# Patient Record
Sex: Female | Born: 1940 | Race: White | Hispanic: No | Marital: Single | Smoking: Never smoker
Health system: Southern US, Community
[De-identification: ages and names within clinical notes are randomized; demographics above are authoritative.]

## PROBLEM LIST (undated history)

## (undated) DIAGNOSIS — M1990 Unspecified osteoarthritis, unspecified site: Secondary | ICD-10-CM

## (undated) DIAGNOSIS — E7800 Pure hypercholesterolemia, unspecified: Secondary | ICD-10-CM

## (undated) DIAGNOSIS — I519 Heart disease, unspecified: Secondary | ICD-10-CM

## (undated) DIAGNOSIS — I509 Heart failure, unspecified: Secondary | ICD-10-CM

## (undated) DIAGNOSIS — D649 Anemia, unspecified: Secondary | ICD-10-CM

## (undated) DIAGNOSIS — N183 Chronic kidney disease, stage 3 (moderate): Secondary | ICD-10-CM

## (undated) DIAGNOSIS — R0902 Hypoxemia: Secondary | ICD-10-CM

## (undated) DIAGNOSIS — J45909 Unspecified asthma, uncomplicated: Secondary | ICD-10-CM

## (undated) DIAGNOSIS — K219 Gastro-esophageal reflux disease without esophagitis: Secondary | ICD-10-CM

## (undated) DIAGNOSIS — I10 Essential (primary) hypertension: Secondary | ICD-10-CM

## (undated) DIAGNOSIS — J439 Emphysema, unspecified: Secondary | ICD-10-CM

## (undated) DIAGNOSIS — T7840XA Allergy, unspecified, initial encounter: Secondary | ICD-10-CM

## (undated) DIAGNOSIS — J449 Chronic obstructive pulmonary disease, unspecified: Secondary | ICD-10-CM

## (undated) DIAGNOSIS — H269 Unspecified cataract: Secondary | ICD-10-CM

## (undated) DIAGNOSIS — E119 Type 2 diabetes mellitus without complications: Secondary | ICD-10-CM

## (undated) HISTORY — DX: Hypoxemia: R09.02

## (undated) HISTORY — DX: Pure hypercholesterolemia, unspecified: E78.00

## (undated) HISTORY — DX: Anemia, unspecified: D64.9

## (undated) HISTORY — DX: Type 2 diabetes mellitus without complications: E11.9

## (undated) HISTORY — PX: AORTIC VALVE REPLACEMENT: SHX41

## (undated) HISTORY — PX: CHOLECYSTECTOMY: SHX55

## (undated) HISTORY — DX: Chronic obstructive pulmonary disease, unspecified: J44.9

## (undated) HISTORY — DX: Allergy, unspecified, initial encounter: T78.40XA

## (undated) HISTORY — DX: Chronic kidney disease, stage 3 (moderate): N18.3

## (undated) HISTORY — DX: Heart failure, unspecified: I50.9

## (undated) HISTORY — DX: Heart disease, unspecified: I51.9

## (undated) HISTORY — DX: Unspecified cataract: H26.9

## (undated) HISTORY — DX: Emphysema, unspecified: J43.9

## (undated) HISTORY — DX: Gastro-esophageal reflux disease without esophagitis: K21.9

## (undated) HISTORY — PX: APPENDECTOMY: SHX54

## (undated) HISTORY — DX: Unspecified osteoarthritis, unspecified site: M19.90

## (undated) HISTORY — DX: Essential (primary) hypertension: I10

## (undated) HISTORY — DX: Unspecified asthma, uncomplicated: J45.909

---

## 1991-09-08 HISTORY — PX: CORONARY ARTERY BYPASS GRAFT: SHX141

## 2004-10-07 ENCOUNTER — Ambulatory Visit: Payer: Self-pay | Admitting: Cardiology

## 2006-07-17 ENCOUNTER — Ambulatory Visit: Payer: Self-pay | Admitting: Cardiology

## 2007-05-28 ENCOUNTER — Ambulatory Visit: Payer: Self-pay | Admitting: Cardiology

## 2008-02-07 ENCOUNTER — Ambulatory Visit: Payer: Self-pay | Admitting: Cardiology

## 2008-02-09 ENCOUNTER — Ambulatory Visit: Payer: Self-pay | Admitting: Cardiology

## 2008-02-13 ENCOUNTER — Ambulatory Visit: Payer: Self-pay | Admitting: Cardiology

## 2008-02-13 ENCOUNTER — Inpatient Hospital Stay (HOSPITAL_COMMUNITY): Admission: EM | Admit: 2008-02-13 | Discharge: 2008-02-15 | Payer: Self-pay | Admitting: Internal Medicine

## 2008-02-14 ENCOUNTER — Encounter: Payer: Self-pay | Admitting: Internal Medicine

## 2008-02-16 ENCOUNTER — Ambulatory Visit: Payer: Self-pay | Admitting: Cardiology

## 2011-01-20 NOTE — Discharge Summary (Signed)
Rachel Suarez, Rachel Suarez                 ACCOUNT NO.:  [id number]   MEDICAL RECORD NO.:  [id number]          PATIENT TYPE:  INP   LOCATION:  4705                         FACILITY:  [facility]   PHYSICIAN:  Shaune Pascal. Bensimhon, MDDATE OF BIRTH:  1940-10-05   DATE OF ADMISSION:  02/13/2008  DATE OF DISCHARGE:  02/15/2008                               DISCHARGE SUMMARY   PRIMARY CARDIOLOGIST:  Ernestine Mcmurray, MD,FACC in [place].   PRIMARY CARE PHYSICIAN:  Wiliam Ke.   PROCEDURES PERFORMED DURING HOSPITALIZATION:  1. Cardiac catheterization completed by Dr. Eustace Quail which      revealed patent LIMA to LAD.  There was no right heart cath done      secondary to access difficulty.  The patient had diastolic      dysfunction.  Please see Dr. Nichola Sizer note for more details.  2. Echocardiogram completed revealing left ventricular ejection      fraction of 55% with mild aortic valve stenosis with a mean      gradient of 12 mmHg.   FINAL DISCHARGE DIAGNOSES:  1. Dyspnea on exertion.      a.     Normal ejection fraction with mild aortic valve stenosis.      b.     Ambulating O2 sats, decreased to 88%.  2. Coronary artery disease status post coronary artery bypass grafting      in 1993 at [place].      a.     Status post cardiac catheterization with patent grafts.  3. Chronic obstructive pulmonary disease  4. Obstructive sleep apnea.  5. Morbid obesity.  6. Pulmonary hypertension (moderate).  7. Diastolic heart failure, acute on chronic.   HOSPITAL COURSE:  This is a 70 year old morbidly obese Caucasian female  with history of coronary artery disease status post three-vessel  coronary artery bypass grafting in 1993 at [facility] with  shortness of breath and CHF.  The patient was seen by Dr. Seward Speck and  cardiac consultation was completed.  The patient had normal coronary  cardiac enzymes.  The patient is a New York Heart Association class III  heart failure patient.  She is not ambulatory  and becomes easily  dyspneic with minimal exertion.   Secondary to complaints of recurrent shortness of breath and heart  failure with known history of coronary artery disease, the patient was  transferred to [facility] for cardiac catheterization.  The  patient was seen and examined by Dr. Johnny Bridge on admission and found  to be stable.  The patient's blood pressure was normal.  She weighed 136  kg.  The patient was scheduled for cardiac catheterization through  brachial access on February 13, 2008.  The patient's right coronary artery  was okay.  Circumflex was okay.  LAD had 70% proximal with competing  flow and LIMA to LAD was okay.  The patient was scheduled to have a  right heart cath; however, they were unable to access patient for that.  The patient was seen the day following by Dr. Domenic Polite who suggested the  patient have a walking O2  saturation and an echocardiogram completed.  Echocardiogram was completed on February 14, 2008, revealing LVEF of 55% with  mild aortic valve stenosis.  They were unable to see right ventricular  pressures or valves secondary to poor in quality and the patient's body  habitus.  The patient did have ambulatory O2 which showed 88%.  The  patient did qualify for home O2 with discussion about this with the  patient, she refused that.   On day of discharge, the patient was stable.  Blood pressure was  normalized.  She was instituted on Imdur 30 mg daily and Vasotec 5 mg  daily during the hospitalization and has tolerated that.  She was seen  by Dr. Haroldine Laws who suggested an outpatient right heart cath to the [facility]  lab and Dr. Arlina Robes discretion should he request further evaluation for  pulmonary hypertension at a followup appointment.  The patient was  anxious to return home and medications were prescribed.  The patient's  digoxin was discontinued.   DISCHARGE VITAL SIGNS:  Blood pressure 114/61, heart rate 64,  respirations 20, temperature 97.4,  O2 sat 97%.   Sodium 140, potassium 4.4, chloride 102, CO2 31, BUN 17, creatinine 1.0,  glucose 180, hemoglobin 10, hematocrit 30.2, white blood cells 6.4,  platelets 194, total cholesterol 149, triglycerides 202, HDL 44, and LDL  65.   DISCHARGE MEDICATIONS:  1. Metformin 1000 mg in the morning and 500 mg in the p.m.  2. Neurontin 300 mg t.i.d.  3. Allopurinol 300 mg daily.  4. Aspirin 325 daily.  5. Zebeta 2.5 mg daily.  6. Protonix 40 mg daily.  7. Reglan 10 mg three times a day.  8. Imdur 30 mg daily (new prescription provided).  9. Lasix 40 mg daily in the a.m., 20 mg in the p.m.  10.Potassium 20 mEq twice a day.  11.Flexeril 10 mg twice a day.  12.Crestor 20 mg at bedtime.  13.Vasotec 5 mg daily (new prescription provided).  14.Home O2 has been suggested at 2 L during the day (the patient has      refused at this time).   ALLERGIES:  The patient had allergies to DARVON, CODEINE, and CORTISONE.   FOLLOW-UP PLANS AND APPOINTMENT:  1. The patient is to follow up with Dr. Dannielle Burn on April 09, 2008, at      10:30 a.m.  2. Follow-up right heart catheterization can be completed as an      outpatient in the [facility] lab to [facility] at Dr. Arlina Robes      discretion.  3. It has been advised the patient have home O2 secondary to      ambulating desaturations.  The patient has refused.  4. Digoxin has been discontinued.  5. The patient has been advised on new medications that have been      prescribed on discharge to include Imdur and Vasotec.  6. The patient has been advised on post cardiac catheterization      instructions with particular emphasis on the right      arm brachial site for evidence of bleeding, hematoma, or signs of      infection.  7. The patient has been advised to bring all medications to follow up      appointments.   Time spent with the patient to include physician time 40 minutes.      Phill Myron. Purcell Nails, NP      [place] Bensimhon, MD   Electronically Signed    KML/MEDQ  D:  02/15/2008  T:  02/16/2008  Job:  [id number]   cc:   Wiliam Ke

## 2011-01-20 NOTE — Cardiovascular Report (Signed)
Rachel Suarez, Rachel Suarez                 ACCOUNT NO.:  [id number]   MEDICAL RECORD NO.:  [id number]          PATIENT TYPE:  INP   LOCATION:  4705                         FACILITY:  [facility]   PHYSICIAN:  Vanna Scotland. Olevia Perches, MD, FACCDATE OF BIRTH:  1940-10-17   DATE OF PROCEDURE:  02/13/2008  DATE OF DISCHARGE:  02/15/2008                            CARDIAC CATHETERIZATION   CLINICAL HISTORY:  Rachel Suarez is a 70 year old and had a history of  previous coronary artery bypass surgery.  She recently developed  increased symptoms of dyspnea and was seen by Dr. Dannielle Burn in [place].  It  was not clear whether dyspnea is related to ischemia or heart failure  and she was referred to [facility] for evaluation with  catheterization.   PROCEDURE:  Because of the patient's severe obesity, the procedure was  performed via the left brachial artery.  This was performed with 5-  [place] preformed coronary catheters.  A 5-French AL-1 catheter was used  for injection of the left coronary artery and FL 3.5 was used for  injection of the right coronary artery and mammary.  We attempted access  via the left subclavian vein, but were unable to obtain an access due to  occlusion of the subclavian vein, which was subsequently documented by  venogram.  Due to the patient's discomfort., we elected not to proceed  with that.   RESULTS:  The right coronary artery:  The right coronary artery was free  of significant disease and had only irregularities.   The left main coronary artery:  The left main coronary artery was free  of significant disease.   The left anterior descending artery:  The left anterior descending  artery had a 70% proximal stenosis with competing flow distally.   The circumflex artery.  The circumflex artery was free of significant  disease.   The LIMA graft to the LAD was patent and functioned normally.   CONCLUSION:  1. Coronary artery disease status post prior coronary artery bypass      graft  surgery.  2. Native vessel disease with 70% narrowing in the proximal LAD with      competing flow distally and no significant obstruction of the right      coronary artery or circumflex artery.  3. Patent LIMA graft to the LAD.   We did not do a left ventriculogram and did not do a right heart  catheterization due to very difficult procedure and the patient's  discomfort.  I discussed with Dr. Dannielle Burn regarding whether we should try  further attempts to do right heart catheterization at a later time.  We  will get a 2-D echo to evaluate the patient's LV function.      Bruce Alfonso Patten Olevia Perches, MD, [facility]  Electronically Signed     BRB/MEDQ  D:  06/14/2008  T:  06/15/2008  Job:  [id number]   cc:   Ernestine Mcmurray, MD,FACC  Wiliam Ke

## 2011-06-04 LAB — IRON AND TIBC
Iron: 74
Saturation Ratios: 21
TIBC: 349

## 2011-06-04 LAB — BASIC METABOLIC PANEL
BUN: 17
BUN: 22
Calcium: 8.4
Chloride: 101
Chloride: 104
Creatinine, Ser: 1.09
GFR calc Af Amer: >60
GFR calc Af Amer: >60
GFR calc non Af Amer: 53 — ABNORMAL LOW
Glucose, Bld: 127 — ABNORMAL HIGH
Potassium: 4.6
Potassium: 4.7
Sodium: 140
Sodium: 140

## 2011-06-04 LAB — FERRITIN: Ferritin: 35 (ref 10–291)

## 2011-06-04 LAB — CBC
HCT: 32 — ABNORMAL LOW
HCT: 33.6 — ABNORMAL LOW
Hemoglobin: 10 — ABNORMAL LOW
Hemoglobin: 10.6 — ABNORMAL LOW
MCV: 95
MCV: 95.2
Platelets: 262
RBC: 3.13 — ABNORMAL LOW
RBC: 3.37 — ABNORMAL LOW
RDW: 15.6 — ABNORMAL HIGH
RDW: 15.6 — ABNORMAL HIGH
WBC: 7.5

## 2011-06-04 LAB — LIPID PANEL
HDL: 44
Total CHOL/HDL Ratio: 3.4

## 2011-06-04 LAB — TRANSFERRIN: Transferrin: 307

## 2011-09-21 DIAGNOSIS — I10 Essential (primary) hypertension: Secondary | ICD-10-CM

## 2011-09-21 DIAGNOSIS — I509 Heart failure, unspecified: Secondary | ICD-10-CM | POA: Insufficient documentation

## 2011-09-21 HISTORY — DX: Essential (primary) hypertension: I10

## 2015-02-07 ENCOUNTER — Encounter (INDEPENDENT_AMBULATORY_CARE_PROVIDER_SITE_OTHER): Payer: Self-pay | Admitting: *Deleted

## 2015-02-07 NOTE — Telephone Encounter (Signed)
This encounter was created in error - please disregard.

## 2015-03-06 ENCOUNTER — Ambulatory Visit (INDEPENDENT_AMBULATORY_CARE_PROVIDER_SITE_OTHER): Payer: Medicare Other | Admitting: Internal Medicine

## 2015-03-06 ENCOUNTER — Encounter (INDEPENDENT_AMBULATORY_CARE_PROVIDER_SITE_OTHER): Payer: Self-pay | Admitting: Internal Medicine

## 2015-03-06 ENCOUNTER — Telehealth (INDEPENDENT_AMBULATORY_CARE_PROVIDER_SITE_OTHER): Payer: Self-pay | Admitting: *Deleted

## 2015-03-06 ENCOUNTER — Other Ambulatory Visit (INDEPENDENT_AMBULATORY_CARE_PROVIDER_SITE_OTHER): Payer: Self-pay | Admitting: *Deleted

## 2015-03-06 VITALS — BP 132/62 | HR 76 | Temp 98.6°F | Ht 62.0 in | Wt 242.3 lb

## 2015-03-06 DIAGNOSIS — R195 Other fecal abnormalities: Secondary | ICD-10-CM

## 2015-03-06 DIAGNOSIS — I519 Heart disease, unspecified: Secondary | ICD-10-CM | POA: Diagnosis not present

## 2015-03-06 DIAGNOSIS — R1310 Dysphagia, unspecified: Secondary | ICD-10-CM

## 2015-03-06 DIAGNOSIS — Z800 Family history of malignant neoplasm of digestive organs: Secondary | ICD-10-CM

## 2015-03-06 DIAGNOSIS — E119 Type 2 diabetes mellitus without complications: Secondary | ICD-10-CM | POA: Insufficient documentation

## 2015-03-06 DIAGNOSIS — K219 Gastro-esophageal reflux disease without esophagitis: Secondary | ICD-10-CM | POA: Diagnosis not present

## 2015-03-06 DIAGNOSIS — R1314 Dysphagia, pharyngoesophageal phase: Secondary | ICD-10-CM | POA: Diagnosis not present

## 2015-03-06 DIAGNOSIS — E7800 Pure hypercholesterolemia, unspecified: Secondary | ICD-10-CM | POA: Insufficient documentation

## 2015-03-06 DIAGNOSIS — R634 Abnormal weight loss: Secondary | ICD-10-CM

## 2015-03-06 MED ORDER — PEG 3350-KCL-NA BICARB-NACL 420 G PO SOLR: 4000.0000 mL | Freq: Once | ORAL | Status: DC

## 2015-03-06 MED ORDER — OMEPRAZOLE 40 MG PO CPDR: 40.0000 mg | DELAYED_RELEASE_CAPSULE | Freq: Every day | ORAL | Status: DC

## 2015-03-06 NOTE — Progress Notes (Signed)
Subjective:    Patient ID: Rachel Suarez, female    DOB: [birth year], 74 y.o.   MRN: [id number]  HPI Referred to our office by Dr. Eula Fried for dysphagia, GERD. Hamburgers and bread in particular will lodge. She says foods are lodging in her esophagus. Symptoms x 6 months. She occasionally has acid reflux.  She was taking ? Pill but did not help. She belches and has hiccups frequently. She occasionally has nausea. She does not have any abdominal pain.  Her appetite is not good. She has lost 40 pounds over the past 2 months.  Her normal weight is 263. She tells me she also has had diarrhea for two months. She is having 10-12 stools some days and some days she will have 2-3 stools. She has weakness.  She has not seen any blood in her stool except when she is constipated. She has constipation maybe 2-3 times a week.  Family hx of colon cancer in mother in her 48s. Her last colonoscopy years ago (greater than 10)  Patient is on oxygen Hx of ERCP with biliary sphincterotomy and placement of pancreatic stent felt to have biliary colic, felt either spincter of Oddie dysfunction or small stones. In 06/22/2008  Review of Systems Past Medical History  Diagnosis Date  . Diabetes     x 30 yrs  . Heart disease   . High cholesterol     Past Surgical History  Procedure Laterality Date  . Cholecystectomy    . Coronary artery bypass graft      Allergies  Allergen Reactions  . Cortisone     Rash, itch, burn    No current outpatient prescriptions on file prior to visit.   No current facility-administered medications on file prior to visit.   No current outpatient prescriptions on file prior to visit.   No current facility-administered medications on file prior to visit.   Current Outpatient Prescriptions  Medication Sig Dispense Refill  . allopurinol (ZYLOPRIM) 300 MG tablet Take 300 mg by mouth daily.    Marland Kitchen aspirin 81 MG tablet Take 81 mg by mouth daily.    . bisoprolol (ZEBETA) 5 MG  tablet Take 5 mg by mouth daily.    . cholecalciferol (VITAMIN D) 400 UNITS TABS tablet Take 5,000 Units by mouth.    . digoxin (LANOXIN) 0.125 MG tablet Take by mouth daily.    Marland Kitchen exenatide (BYETTA) 10 MCG/0.04ML SOPN injection Inject 10 mcg into the skin 2 (two) times daily with a meal.    . furosemide (LASIX) 40 MG tablet Take 40 mg by mouth 2 (two) times daily.    Marland Kitchen gabapentin (NEURONTIN) 300 MG capsule Take 300 mg by mouth 3 (three) times daily.    . insulin glargine (LANTUS) 100 UNIT/ML injection Inject into the skin 2 (two) times daily. 120 am    . isosorbide mononitrate (IMDUR) 60 MG 24 hr tablet Take 60 mg by mouth daily.    . ondansetron (ZOFRAN) 4 MG tablet Take 4 mg by mouth every 8 (eight) hours as needed for nausea or vomiting.    . rosuvastatin (CRESTOR) 20 MG tablet Take 20 mg by mouth daily.    . sitaGLIPtin (JANUVIA) 100 MG tablet Take 100 mg by mouth daily.     No current facility-administered medications for this visit.         Objective:   Physical Exam Patient is wheelchair bound Blood pressure 132/62, pulse 76, temperature 98.6 F (37 C), height 5\' 2"  (1.575  m), weight 242 lb 4.8 oz (109.907 kg). Alert and oriented. Skin warm and dry. Oral mucosa is moist.   . Sclera anicteric, conjunctivae is pink. Thyroid not enlarged. No cervical lymphadenopathy. Lungs clear. Heart regular rate and rhythm.Loud murmur heard  Abdomen is soft. Bowel sounds are positive. No hepatomegaly. No abdominal masses felt. No tenderness.  No edema to lower extremities.        Assessment & Plan:  Dysphagia. Stricture needs to be ruled out.  Diarrhea/alternationg with constipation. Family hx of colon cancer. Weight loss. Colonic neoplasm needs to be ruled out.  EGD/ED,Colonoscopy.The risks and benefits such as perforation, bleeding, and infection were reviewed with the patient and is agreeable. Rx for Omeprazole 40mg  sent to her pharmacy

## 2015-03-06 NOTE — Telephone Encounter (Signed)
Patient needs trilyte 

## 2015-03-06 NOTE — Patient Instructions (Signed)
EGD/ED, colonoscopy. The risks and benefits such as perforation, bleeding, and infection were reviewed with the patient and is agreeable.

## 2015-03-21 ENCOUNTER — Encounter (INDEPENDENT_AMBULATORY_CARE_PROVIDER_SITE_OTHER): Payer: Self-pay

## 2015-03-29 ENCOUNTER — Encounter (HOSPITAL_COMMUNITY): Payer: Self-pay

## 2015-03-29 ENCOUNTER — Ambulatory Visit (HOSPITAL_COMMUNITY)
Admission: RE | Admit: 2015-03-29 | Discharge: 2015-03-29 | Disposition: A | Discharge status: Home / Self Care | Payer: Medicare Other | Source: Ambulatory Visit | Attending: Internal Medicine | Admitting: Internal Medicine

## 2015-03-29 ENCOUNTER — Encounter (HOSPITAL_COMMUNITY)
Admission: RE | Disposition: A | Discharge status: Home / Self Care | Payer: Self-pay | Source: Ambulatory Visit | Attending: Internal Medicine

## 2015-03-29 DIAGNOSIS — D175 Benign lipomatous neoplasm of intra-abdominal organs: Secondary | ICD-10-CM | POA: Insufficient documentation

## 2015-03-29 DIAGNOSIS — E780 Pure hypercholesterolemia: Secondary | ICD-10-CM | POA: Insufficient documentation

## 2015-03-29 DIAGNOSIS — Z7982 Long term (current) use of aspirin: Secondary | ICD-10-CM | POA: Insufficient documentation

## 2015-03-29 DIAGNOSIS — R197 Diarrhea, unspecified: Secondary | ICD-10-CM | POA: Insufficient documentation

## 2015-03-29 DIAGNOSIS — R1310 Dysphagia, unspecified: Secondary | ICD-10-CM | POA: Diagnosis not present

## 2015-03-29 DIAGNOSIS — E119 Type 2 diabetes mellitus without complications: Secondary | ICD-10-CM | POA: Insufficient documentation

## 2015-03-29 DIAGNOSIS — D123 Benign neoplasm of transverse colon: Secondary | ICD-10-CM | POA: Insufficient documentation

## 2015-03-29 DIAGNOSIS — D124 Benign neoplasm of descending colon: Secondary | ICD-10-CM

## 2015-03-29 DIAGNOSIS — Z794 Long term (current) use of insulin: Secondary | ICD-10-CM | POA: Diagnosis not present

## 2015-03-29 DIAGNOSIS — Z1211 Encounter for screening for malignant neoplasm of colon: Secondary | ICD-10-CM | POA: Insufficient documentation

## 2015-03-29 DIAGNOSIS — Z79899 Other long term (current) drug therapy: Secondary | ICD-10-CM | POA: Insufficient documentation

## 2015-03-29 DIAGNOSIS — Z800 Family history of malignant neoplasm of digestive organs: Secondary | ICD-10-CM | POA: Diagnosis not present

## 2015-03-29 DIAGNOSIS — R195 Other fecal abnormalities: Secondary | ICD-10-CM

## 2015-03-29 DIAGNOSIS — D125 Benign neoplasm of sigmoid colon: Secondary | ICD-10-CM | POA: Insufficient documentation

## 2015-03-29 DIAGNOSIS — R1314 Dysphagia, pharyngoesophageal phase: Secondary | ICD-10-CM | POA: Diagnosis not present

## 2015-03-29 DIAGNOSIS — Z951 Presence of aortocoronary bypass graft: Secondary | ICD-10-CM | POA: Insufficient documentation

## 2015-03-29 DIAGNOSIS — K5730 Diverticulosis of large intestine without perforation or abscess without bleeding: Secondary | ICD-10-CM

## 2015-03-29 DIAGNOSIS — K644 Residual hemorrhoidal skin tags: Secondary | ICD-10-CM | POA: Diagnosis not present

## 2015-03-29 HISTORY — PX: ESOPHAGOGASTRODUODENOSCOPY: SHX5428

## 2015-03-29 HISTORY — PX: ESOPHAGEAL DILATION: SHX303

## 2015-03-29 HISTORY — PX: COLONOSCOPY: SHX5424

## 2015-03-29 LAB — GLUCOSE, CAPILLARY: Glucose-Capillary: 114 mg/dL — ABNORMAL HIGH (ref 65–99)

## 2015-03-29 SURGERY — COLONOSCOPY
Anesthesia: Moderate Sedation

## 2015-03-29 MED ORDER — BENEFIBER DRINK MIX PO PACK: 4.0000 g | PACK | Freq: Every day | ORAL | Status: DC

## 2015-03-29 MED ORDER — MIDAZOLAM HCL 5 MG/5ML IJ SOLN
INTRAMUSCULAR | Status: DC | PRN
  Administered 2015-03-29: 1 mg via INTRAVENOUS
  Administered 2015-03-29 (×2): 2 mg via INTRAVENOUS
  Administered 2015-03-29: 1 mg via INTRAVENOUS
  Administered 2015-03-29: 2 mg via INTRAVENOUS
  Administered 2015-03-29 (×4): 1 mg via INTRAVENOUS

## 2015-03-29 MED ORDER — MIDAZOLAM HCL 5 MG/5ML IJ SOLN
INTRAMUSCULAR | Status: AC
  Filled 2015-03-29: qty 5

## 2015-03-29 MED ORDER — MEPERIDINE HCL 50 MG/ML IJ SOLN
INTRAMUSCULAR | Status: AC
  Filled 2015-03-29: qty 1

## 2015-03-29 MED ORDER — MEPERIDINE HCL 50 MG/ML IJ SOLN
INTRAMUSCULAR | Status: DC | PRN
  Administered 2015-03-29 (×2): 25 mg via INTRAVENOUS

## 2015-03-29 MED ORDER — STERILE WATER FOR IRRIGATION IR SOLN
Status: DC | PRN
  Administered 2015-03-29: 14:00:00

## 2015-03-29 MED ORDER — MIDAZOLAM HCL 5 MG/5ML IJ SOLN
INTRAMUSCULAR | Status: AC
  Filled 2015-03-29: qty 10

## 2015-03-29 MED ORDER — SODIUM CHLORIDE 0.9 % IV SOLN
INTRAVENOUS | Status: DC
  Administered 2015-03-29: 13:00:00 via INTRAVENOUS

## 2015-03-29 MED ORDER — BUTAMBEN-TETRACAINE-BENZOCAINE 2-2-14 % EX AERO
INHALATION_SPRAY | CUTANEOUS | Status: DC | PRN
  Administered 2015-03-29: 2 via TOPICAL

## 2015-03-29 NOTE — Op Note (Signed)
EGD ED AND COLONOSCOPY PROCEDURE REPORT  PATIENT:  Rachel Suarez  MR#:  [id number] Birthdate:  August 23, 1941, 74 y.o., female Endoscopist:  Dr. Rogene Houston, MD Referred By:  Dr. Cleda Mccreedy, MD  Procedure Date: 03/29/2015  Procedure:   EGD ED & Colonoscopy  Indications:  Patient is 74 year old Caucasian female who presents with intermittent solid food dysphagia which started over 2 months ago. Heartburns well controlled with PPI. She is also undergoing colonoscopy for screening and diagnostic purposes. Last exam was 12 years ago. She gets over 2 month history of nonbloody diarrhea.            Informed Consent:  The risks, benefits, alternatives & imponderables which include, but are not limited to, bleeding, infection, perforation, drug reaction and potential missed lesion have been reviewed.  The potential for biopsy, lesion removal, esophageal dilation, etc. have also been discussed.  Questions have been answered.  All parties agreeable.  Please see history & physical in medical record for more information.  Medications:  Demerol 50 mg IV Versed 12 mg IV Cetacaine spray topically for oropharyngeal anesthesia  EGD  Description of procedure:  The endoscope was introduced through the mouth and advanced to the second portion of the duodenum without difficulty or limitations. The mucosal surfaces were surveyed very carefully during advancement of the scope and upon withdrawal.  Findings:  Esophagus:  Mucosa of the esophagus was normal. GE junction was unremarkable. GEJ:  39 cm Stomach:  Stomach was empty and distended very well with insufflation. Folds in the proximal stomach were normal. Examination mucosa gastric body, antrum, pyloric channel, angularis fundus and cardia was normal. Duodenum:  Normal bulbar mucosa. 8 mm submucosal lesion noted at angle of duodenum with features of a small lipoma. Post bulbar mucosa was normal.  Therapeutic/Diagnostic Maneuvers Performed:   Esophagus  dilated by passing 49 French Maloney dilator to full insertion. As the dilator was withdrawn endoscope was passed again and no mucosal disruption noted.  COLONOSCOPY Description of procedure:  After a digital rectal exam was performed, that colonoscope was advanced from the anus through the rectum and colon to the area of the cecum, ileocecal valve and appendiceal orifice. The cecum was deeply intubated. These structures were well-seen and photographed for the record. From the level of the cecum and ileocecal valve, the scope was slowly and cautiously withdrawn. The mucosal surfaces were carefully surveyed utilizing scope tip to flexion to facilitate fold flattening as needed. The scope was pulled down into the rectum where a thorough exam including retroflexion was performed.  Findings:   Prep satisfactory. 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 Three small polyps were hot snared from proximal transverse colon and submitted together. 8 mm polyp hot snared from proximal transverse colon and submitted separately. 12 mm broad-based polyp snared piecemeal also from proximal transverse colon and submitted separately. 6 polyps or hot snared from attic flexure descending and sigmoid colon ranging in size from 6-10 mm. One polyp was lost. Single 360 applied to polypectomy site at descending colon. Few small diverticula at sigmoid colon. Random biopsies taken from normal-appearing mucosa of sigmoid colon along with stool sample for culture and O&P. Normal rectal mucosa. Small hemorrhoids below the dentate line.    Therapeutic/Diagnostic Maneuvers Performed:  See above  Complications:  None  Cecal Withdrawal Time:  40 minutes  Impression:  EGD findings; Incidental finding off small duodenal lipoma otherwise normal EGD. Esophagus dilated by passing 54 French Maloney dilator but no mucosal disruption  induced.  Colonoscopy findings; Examination performed to cecum. 12 mm submucosal  lipoma at proximal transverse colon. Eleven polyps were snared and one polyp was lost. Description as follows.    Three small polyps from transverse colon was submitted together.     57mm polyp from proximal transverse colon was hot snared.    12broad-based polyp from proximal transverse colon was snared             piecemeal.  Polypectomy complete.     Six polyps or hot snared. One polyp was lost and 5 polyps were    submitted     together(28 splenic flexure, 2 at at descending and 1 at    sigmoid colon). Mild sigmoid colon diverticulosis. Random biopsies taken from mucosa of sigmoid colon looking for microscopic colitis. Small external hemorrhoids.  Recommendations:  Standard instructions given. No aspirin or NSAIDs for 10 days. Benefiber 4 g by mouth daily at bedtime. I will be contacting patient with biopsy results    Decker Cogdell U  03/29/2015 3:57 PM  CC: Dr. Cleda Mccreedy, MD & Dr. No ref. provider found

## 2015-03-29 NOTE — H&P (Signed)
Rachel Suarez is an 74 y.o. female.   Chief Complaint: Patient is here for EGD, ED and colonoscopy. HPI: Patient is 74 year old Caucasian female with multiple medical problems presents with 2 months history of solid food dysphagia. She's been having frequent heartburn but feels much better since begun on PPI. She is also undergoing colonoscopy both for screening and diagnostic purposes. Last exam was 5 years ago. She says she's had diarrhea for 2 months some days 8-10 stools. She denies melena or rectal bleeding fever or chills. She does give history of melena antibody for sinusitis few months ago. History is negative for CRC.  Past Medical History  Diagnosis Date  . Diabetes     x 30 yrs  . Heart disease   . High cholesterol     Past Surgical History  Procedure Laterality Date  . Cholecystectomy    . Coronary artery bypass graft      History reviewed. No pertinent family history. Social History:  reports that she has never smoked. She does not have any smokeless tobacco history on file. She reports that she does not drink alcohol or use illicit drugs.  Allergies:  Allergies  Allergen Reactions  . Cortisone     Rash, itch, burn    Medications Prior to Admission  Medication Sig Dispense Refill  . allopurinol (ZYLOPRIM) 300 MG tablet Take 300 mg by mouth daily.    Marland Kitchen aspirin 81 MG tablet Take 81 mg by mouth daily.    . bisoprolol (ZEBETA) 5 MG tablet Take 5 mg by mouth daily.    . cholecalciferol (VITAMIN D) 400 UNITS TABS tablet Take 5,000 Units by mouth.    . digoxin (LANOXIN) 0.125 MG tablet Take by mouth daily.    Marland Kitchen exenatide (BYETTA) 10 MCG/0.04ML SOPN injection Inject 10 mcg into the skin 2 (two) times daily with a meal.    . furosemide (LASIX) 40 MG tablet Take 40 mg by mouth 2 (two) times daily.    Marland Kitchen gabapentin (NEURONTIN) 300 MG capsule Take 300 mg by mouth 3 (three) times daily.    . insulin glargine (LANTUS) 100 UNIT/ML injection Inject into the skin 2 (two) times  daily. 120 am    . isosorbide mononitrate (IMDUR) 60 MG 24 hr tablet Take 60 mg by mouth daily.    Marland Kitchen omeprazole (PRILOSEC) 40 MG capsule Take 1 capsule (40 mg total) by mouth daily. 90 capsule 3  . ondansetron (ZOFRAN) 4 MG tablet Take 4 mg by mouth every 8 (eight) hours as needed for nausea or vomiting.    . polyethylene glycol-electrolytes (NULYTELY/GOLYTELY) 420 G solution Take 4,000 mLs by mouth once. 4000 mL 0  . rosuvastatin (CRESTOR) 20 MG tablet Take 20 mg by mouth daily.    . sitaGLIPtin (JANUVIA) 100 MG tablet Take 100 mg by mouth daily.      No results found for this or any previous visit (from the past 48 hour(s)). No results found.  ROS  Blood pressure 124/50, pulse 69, temperature 98.2 F (36.8 C), temperature source Oral, resp. rate 22, height 5\' 2"  (1.575 m), weight 242 lb (109.77 kg), SpO2 96 %. Physical Exam  Constitutional: She appears well-developed and well-nourished.  HENT:  Mouth/Throat: Oropharynx is clear and moist.  Patient is edentulous and has dentures.  Eyes: Conjunctivae are normal. No scleral icterus.  Neck: No thyromegaly present.  Cardiovascular: Normal rate and regular rhythm.   Murmur (grade 3/6 high-pitched systolic ejection murmur heard all over precordium) heard. Respiratory: Effort  normal and breath sounds normal.  GI:  Protuberant abdomen with few small areas of ecchymosis. All subcutaneous lipoma noted below left costal margin. Abdomen is soft with mild tenderness at LLQ. Long right paramedian scar but no masses.  Musculoskeletal: She exhibits edema (trace edema around ankles).  Lymphadenopathy:    She has no cervical adenopathy.  Neurological: She is alert.  Skin: Skin is warm and dry.     Assessment/Plan Solid food dysphagia. Diagnostic/screening colonoscopy.  Rachel Suarez U 03/29/2015, 2:06 PM

## 2015-04-01 LAB — OVA AND PARASITE EXAMINATION: Ova and parasites: NONE SEEN

## 2015-04-01 NOTE — Discharge Instructions (Signed)
No aspirin or NSAIDs for 10 days. Can take Tylenol up to 2 g per day as needed for headache or joint pain. Resume other medications as before. High fiber diet. Benefiber 4 g by mouth daily at bedtime. Physician will call with stool tests and biopsy.  Colonoscopy, Care After These instructions give you information on caring for yourself after your procedure. Your doctor may also give you more specific instructions. Call your doctor if you have any problems or questions after your procedure. HOME CARE  Do not drive for 24 hours.  Do not sign important papers or use machinery for 24 hours.  You may shower.  You may go back to your usual activities, but go slower for the first 24 hours.  Take rest breaks often during the first 24 hours.  Walk around or use warm packs on your belly (abdomen) if you have belly cramping or gas.  Drink enough fluids to keep your pee (urine) clear or pale yellow.  Resume your normal diet. Avoid heavy or fried foods.  Avoid drinking alcohol for 24 hours or as told by your doctor.  Only take medicines as told by your doctor. If a tissue sample (biopsy) was taken during the procedure:   Do not take aspirin or blood thinners for 7 days, or as told by your doctor.  Do not drink alcohol for 7 days, or as told by your doctor.  Eat soft foods for the first 24 hours. GET HELP IF: You still have a small amount of blood in your poop (stool) 2-3 days after the procedure. GET HELP RIGHT AWAY IF:  You have more than a small amount of blood in your poop.  You see clumps of tissue (blood clots) in your poop.  Your belly is puffy (swollen).  You feel sick to your stomach (nauseous) or throw up (vomit).  You have a fever.  You have belly pain that gets worse and medicine does not help. MAKE SURE YOU:  Understand these instructions.  Will watch your condition.  Will get help right away if you are not doing well or get worse. Document Released:  09/26/2010 Document Revised: 08/29/2013 Document Reviewed: 05/01/2013 [facility] Patient Information 2015 [place], [place]. This information is not intended to replace advice given to you by your health care provider. Make sure you discuss any questions you have with your health care provider.   Colon Polyps Polyps are lumps of extra tissue growing inside the body. Polyps can grow in the large intestine (colon). Most colon polyps are noncancerous (benign). However, some colon polyps can become cancerous over time. Polyps that are larger than a pea may be harmful. To be safe, caregivers remove and test all polyps. CAUSES  Polyps form when mutations in the genes cause your cells to grow and divide even though no more tissue is needed. RISK FACTORS There are a number of risk factors that can increase your chances of getting colon polyps. They include:  Being older than 50 years.  Family history of colon polyps or colon cancer.  Long-term colon diseases, such as colitis or Crohn disease.  Being overweight.  Smoking.  Being inactive.  Drinking too much alcohol. SYMPTOMS  Most small polyps do not cause symptoms. If symptoms are present, they may include:  Blood in the stool. The stool may look dark red or black.  Constipation or diarrhea that lasts longer than 1 week. DIAGNOSIS People often do not know they have polyps until their caregiver finds them during a  regular checkup. Your caregiver can use 4 tests to check for polyps:  Digital rectal exam. The caregiver wears gloves and feels inside the rectum. This test would find polyps only in the rectum.  Barium enema. The caregiver puts a liquid called barium into your rectum before taking X-rays of your colon. Barium makes your colon look white. Polyps are dark, so they are easy to see in the X-ray pictures.  Sigmoidoscopy. A thin, flexible tube (sigmoidoscope) is placed into your rectum. The sigmoidoscope has a light and tiny camera in  it. The caregiver uses the sigmoidoscope to look at the last third of your colon.  Colonoscopy. This test is like sigmoidoscopy, but the caregiver looks at the entire colon. This is the most common method for finding and removing polyps. TREATMENT  Any polyps will be removed during a sigmoidoscopy or colonoscopy. The polyps are then tested for cancer. PREVENTION  To help lower your risk of getting more colon polyps:  Eat plenty of fruits and vegetables. Avoid eating fatty foods.  Do not smoke.  Avoid drinking alcohol.  Exercise every day.  Lose weight if recommended by your caregiver.  Eat plenty of calcium and folate. Foods that are rich in calcium include milk, cheese, and broccoli. Foods that are rich in folate include chickpeas, kidney beans, and spinach. HOME CARE INSTRUCTIONS Keep all follow-up appointments as directed by your caregiver. You may need periodic exams to check for polyps. SEEK MEDICAL CARE IF: You notice bleeding during a bowel movement. Document Released: 05/20/2004 Document Revised: 11/16/2011 Document Reviewed: 11/03/2011 [facility] Patient Information 2015 [place], [place]. This information is not intended to replace advice given to you by your health care provider. Make sure you discuss any questions you have with your health care provider.  Diverticulosis Diverticulosis is the condition that develops when small pouches (diverticula) form in the wall of your colon. Your colon, or large intestine, is where water is absorbed and stool is formed. The pouches form when the inside layer of your colon pushes through weak spots in the outer layers of your colon. CAUSES  No one knows exactly what causes diverticulosis. RISK FACTORS  Being older than 37. Your risk for this condition increases with age. Diverticulosis is rare in people younger than 40 years. By age 51, almost everyone has it.  Eating a low-fiber diet.  Being frequently constipated.  Being  overweight.  Not getting enough exercise.  Smoking.  Taking over-the-counter pain medicines, like aspirin and ibuprofen. SYMPTOMS  Most people with diverticulosis do not have symptoms. DIAGNOSIS  Because diverticulosis often has no symptoms, health care providers often discover the condition during an exam for other colon problems. In many cases, a health care provider will diagnose diverticulosis while using a flexible scope to examine the colon (colonoscopy). TREATMENT  If you have never developed an infection related to diverticulosis, you may not need treatment. If you have had an infection before, treatment may include:  Eating more fruits, vegetables, and grains.  Taking a fiber supplement.  Taking a live bacteria supplement (probiotic).  Taking medicine to relax your colon. HOME CARE INSTRUCTIONS   Drink at least 6-8 glasses of water each day to prevent constipation.  Try not to strain when you have a bowel movement.  Keep all follow-up appointments. If you have had an infection before:  Increase the fiber in your diet as directed by your health care provider or dietitian.  Take a dietary fiber supplement if your health care provider approves.  Only take medicines as directed by your health care provider. SEEK MEDICAL CARE IF:   You have abdominal pain.  You have bloating.  You have cramps.  You have not gone to the bathroom in 3 days. SEEK IMMEDIATE MEDICAL CARE IF:   Your pain gets worse.  Yourbloating becomes very bad.  You have a fever or chills, and your symptoms suddenly get worse.  You begin vomiting.  You have bowel movements that are bloody or black. MAKE SURE YOU:  Understand these instructions.  Will watch your condition.  Will get help right away if you are not doing well or get worse. Document Released: 05/21/2004 Document Revised: 08/29/2013 Document Reviewed: 07/19/2013 [facility] Patient Information 2015 [place], [place]. This  information is not intended to replace advice given to you by your health care provider. Make sure you discuss any questions you have with your health care provider.   High-Fiber Diet Fiber is found in fruits, vegetables, and grains. A high-fiber diet encourages the addition of more whole grains, legumes, fruits, and vegetables in your diet. The recommended amount of fiber for adult males is 38 g per day. For adult females, it is 25 g per day. Pregnant and lactating women should get 28 g of fiber per day. If you have a digestive or bowel problem, ask your caregiver for advice before adding high-fiber foods to your diet. Eat a variety of high-fiber foods instead of only a select few type of foods.  PURPOSE  To increase stool bulk.  To make bowel movements more regular to prevent constipation.  To lower cholesterol.  To prevent overeating. WHEN IS THIS DIET USED?  It may be used if you have constipation and hemorrhoids.  It may be used if you have uncomplicated diverticulosis (intestine condition) and irritable bowel syndrome.  It may be used if you need help with weight management.  It may be used if you want to add it to your diet as a protective measure against atherosclerosis, diabetes, and cancer. SOURCES OF FIBER  Whole-grain breads and cereals.  Fruits, such as apples, oranges, bananas, berries, prunes, and pears.  Vegetables, such as green peas, carrots, sweet potatoes, beets, broccoli, cabbage, spinach, and artichokes.  Legumes, such split peas, soy, lentils.  Almonds. FIBER CONTENT IN FOODS Starches and Grains / Dietary Fiber (g)  Cheerios, 1 cup / 3 g  Corn Flakes cereal, 1 cup / 0.7 g  Rice crispy treat cereal, 1 cup / 0.3 g  Instant oatmeal (cooked),  cup / 2 g  Frosted wheat cereal, 1 cup / 5.1 g  Brown, long-grain rice (cooked), 1 cup / 3.5 g  White, long-grain rice (cooked), 1 cup / 0.6 g  Enriched macaroni (cooked), 1 cup / 2.5 g Legumes / Dietary  Fiber (g)  Baked beans (canned, plain, or vegetarian),  cup / 5.2 g  Kidney beans (canned),  cup / 6.8 g  Pinto beans (cooked),  cup / 5.5 g Breads and Crackers / Dietary Fiber (g)  Plain or honey graham crackers, 2 squares / 0.7 g  Saltine crackers, 3 squares / 0.3 g  Plain, salted pretzels, 10 pieces / 1.8 g  Whole-wheat bread, 1 slice / 1.9 g  White bread, 1 slice / 0.7 g  Raisin bread, 1 slice / 1.2 g  Plain bagel, 3 oz / 2 g  Flour tortilla, 1 oz / 0.9 g  Corn tortilla, 1 small / 1.5 g  Hamburger or hotdog bun, 1 small / 0.9 g  Fruits / Dietary Fiber (g)  Apple with skin, 1 medium / 4.4 g  Sweetened applesauce,  cup / 1.5 g  Banana,  medium / 1.5 g  Grapes, 10 grapes / 0.4 g  Orange, 1 small / 2.3 g  Raisin, 1.5 oz / 1.6 g  Melon, 1 cup / 1.4 g Vegetables / Dietary Fiber (g)  Green beans (canned),  cup / 1.3 g  Carrots (cooked),  cup / 2.3 g  Broccoli (cooked),  cup / 2.8 g  Peas (cooked),  cup / 4.4 g  Mashed potatoes,  cup / 1.6 g  Lettuce, 1 cup / 0.5 g  Corn (canned),  cup / 1.6 g  Tomato,  cup / 1.1 g Document Released: 08/24/2005 Document Revised: 02/23/2012 Document Reviewed: 11/26/2011 [employer] Patient Information 2015 [place], [place]. This information is not intended to replace advice given to you by your health care provider. Make sure you discuss any questions you have with your health care provider.

## 2015-04-02 ENCOUNTER — Encounter (HOSPITAL_COMMUNITY): Payer: Self-pay | Admitting: Internal Medicine

## 2015-04-02 LAB — STOOL CULTURE

## 2015-04-09 ENCOUNTER — Encounter (INDEPENDENT_AMBULATORY_CARE_PROVIDER_SITE_OTHER): Payer: Self-pay | Admitting: *Deleted

## 2015-06-24 ENCOUNTER — Telehealth (INDEPENDENT_AMBULATORY_CARE_PROVIDER_SITE_OTHER): Payer: Self-pay | Admitting: *Deleted

## 2015-06-24 NOTE — Telephone Encounter (Signed)
The Omeprazole is not working for her. She takes it 2 hours before meals and is still having abd pain after she eats. Would like to see if there is another medicine that may help her. The return phone number is [phone].

## 2015-06-24 NOTE — Telephone Encounter (Signed)
Line busy. Will call tomorrow

## 2015-06-26 NOTE — Telephone Encounter (Signed)
Rachel Suarez called back and would like for Terri to please call her back. The return phone number is [phone].

## 2015-06-28 NOTE — Telephone Encounter (Signed)
I advised her to take her to take 30 minutes before breakfast and 85minutes before supper. She will call me Monday and see if this helps. If not, will change to another PPI.

## 2015-07-11 NOTE — Telephone Encounter (Signed)
Rachel Suarez said the medicine is not helping her at all. She is in a lot of pain with her stomach. The return phone number is [phone].

## 2015-07-15 ENCOUNTER — Other Ambulatory Visit (INDEPENDENT_AMBULATORY_CARE_PROVIDER_SITE_OTHER): Payer: Self-pay | Admitting: Internal Medicine

## 2015-07-15 MED ORDER — PANTOPRAZOLE SODIUM 40 MG PO TBEC: 40.0000 mg | DELAYED_RELEASE_TABLET | Freq: Every day | ORAL | Status: DC

## 2015-07-15 NOTE — Telephone Encounter (Signed)
I talked with patient this am. Will call Protonix 40mg  in to her drug store

## 2015-10-04 ENCOUNTER — Encounter (INDEPENDENT_AMBULATORY_CARE_PROVIDER_SITE_OTHER): Payer: Self-pay

## 2015-10-04 ENCOUNTER — Other Ambulatory Visit (INDEPENDENT_AMBULATORY_CARE_PROVIDER_SITE_OTHER): Payer: Self-pay | Admitting: Internal Medicine

## 2015-10-04 ENCOUNTER — Telehealth (INDEPENDENT_AMBULATORY_CARE_PROVIDER_SITE_OTHER): Payer: Self-pay | Admitting: Internal Medicine

## 2015-10-04 DIAGNOSIS — D509 Iron deficiency anemia, unspecified: Secondary | ICD-10-CM

## 2015-10-04 DIAGNOSIS — D649 Anemia, unspecified: Secondary | ICD-10-CM

## 2015-10-04 NOTE — Telephone Encounter (Signed)
Rachel Suarez is coming by Monday to have a CBC drawn. Will also due an anemia profile. She will bring her stool cards in from Dr. Eula Fried for me to check.

## 2015-10-08 ENCOUNTER — Encounter (HOSPITAL_COMMUNITY): Admission: RE | Payer: Self-pay | Source: Ambulatory Visit

## 2015-10-08 ENCOUNTER — Ambulatory Visit (HOSPITAL_COMMUNITY): Admission: RE | Admit: 2015-10-08 | Payer: Medicare Other | Source: Ambulatory Visit | Admitting: Internal Medicine

## 2015-10-08 SURGERY — IMAGING PROCEDURE, GI TRACT, INTRALUMINAL, VIA CAPSULE

## 2015-10-09 ENCOUNTER — Encounter (HOSPITAL_COMMUNITY): Payer: Medicare Other

## 2015-12-08 DIAGNOSIS — Z953 Presence of xenogenic heart valve: Secondary | ICD-10-CM | POA: Insufficient documentation

## 2015-12-18 ENCOUNTER — Encounter (INDEPENDENT_AMBULATORY_CARE_PROVIDER_SITE_OTHER): Payer: Self-pay | Admitting: Internal Medicine

## 2015-12-18 ENCOUNTER — Other Ambulatory Visit (INDEPENDENT_AMBULATORY_CARE_PROVIDER_SITE_OTHER): Payer: Self-pay | Admitting: Internal Medicine

## 2015-12-18 ENCOUNTER — Encounter (INDEPENDENT_AMBULATORY_CARE_PROVIDER_SITE_OTHER): Payer: Self-pay | Admitting: *Deleted

## 2015-12-18 ENCOUNTER — Ambulatory Visit (INDEPENDENT_AMBULATORY_CARE_PROVIDER_SITE_OTHER): Payer: Medicare Other | Admitting: Internal Medicine

## 2015-12-18 VITALS — BP 142/80 | HR 84 | Temp 98.4°F | Ht 62.0 in | Wt 238.2 lb

## 2015-12-18 DIAGNOSIS — D649 Anemia, unspecified: Secondary | ICD-10-CM | POA: Insufficient documentation

## 2015-12-18 DIAGNOSIS — D500 Iron deficiency anemia secondary to blood loss (chronic): Secondary | ICD-10-CM | POA: Diagnosis not present

## 2015-12-18 DIAGNOSIS — K921 Melena: Secondary | ICD-10-CM

## 2015-12-18 NOTE — Progress Notes (Signed)
Subjective:    Patient ID: Rachel Suarez, female    DOB: May 31, 1941, 75 y.o.   MRN: [id number]  HPIHere today for f/u after recent admission to [facility] for apparently anemia.  Admitted to [facility] about 2 weeks for anemia. She received 2 units of blood. Admitted 2 before this and received 3 units of blood. She denies seeing any rectal bleeding. Her stools are dark black in color. Stools have been black for about 3 months. Appetite is okay. Her last weight in June was 242. Today her weight is 239.2. She underwent an EGD and Colonoscopy in July of last year for dysphagia and colonoscopy for screening purposes.  Patient denies prior hx of anemia.  She was scheduled for a Given's capsule in January of this year but cancelled. She says she was at [facility] due to cardiac problems the first of February.   She underwent a balloon aortic valvuloplasty for aortic valve disorder/stenosis.  She also underwent an EGD while at [facility] for melena and she tells me it was normal.   Patient is oxygen dependent.   Patient did not bring medications in or a list of her medications.    10/24/2015  OTHER PROCEDURE(S): Balloon Aortic Valvuloplasty Transcatheter Aortic Valve Replacement   STUDY INDICATION(S): Aortic valve disorder/stenosis (424.1)      09/23/2015 CBC WBC 13.2  H and H 7.8 and 27.3, MCV 75.6.  03/29/2015: EGD ED & Colonoscopy  Indications: Patient is 75 year old Caucasian female who presents with intermittent solid food dysphagia which started over 2 months ago. Heartburns well controlled with PPI. She is also undergoing colonoscopy for screening and diagnostic purposes. Last exam was 12 years ago. She gets over 2 month history of nonbloody diarrhea.  Findings:  Prep satisfactory. 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 Three small polyps were hot snared from proximal  transverse colon and submitted together. 8 mm polyp hot snared from proximal transverse colon and submitted separately. 12 mm broad-based polyp snared piecemeal also from proximal transverse colon and submitted separately. 6 polyps or hot snared from attic flexure descending and sigmoid colon ranging in size from 6-10 mm. One polyp was lost. Single 360 applied to polypectomy site at descending colon. Few small diverticula at sigmoid colon. Random biopsies taken from normal-appearing mucosa of sigmoid colon along with stool sample for culture and O&P. Normal rectal mucosa. Small hemorrhoids below the dentate line.  Stool studies are negative and all of the polyps are benign. Sigmoid colon biopsy negative for microscopic colitis. Patient advised to take fiber supplement and stool softener daily since she is constipated. Report to Dr. Eula Fried. Colonoscopy in one year..    Review of Systems Past Medical History  Diagnosis Date  . Diabetes     x 30 yrs  . Heart disease   . High cholesterol     Past Surgical History  Procedure Laterality Date  . Cholecystectomy    . Coronary artery bypass graft    . Colonoscopy N/A 03/29/2015    Procedure: COLONOSCOPY;  Surgeon: Rogene Houston, MD;  [facility];  Service: Endoscopy;  Laterality: N/A;  1015 - moved to 1:10 - Ann to notify  . Esophagogastroduodenoscopy N/A 03/29/2015    Procedure: ESOPHAGOGASTRODUODENOSCOPY (EGD);  Surgeon: Rogene Houston, MD;  [facility];  Service: Endoscopy;  Laterality: N/A;  . Esophageal dilation N/A 03/29/2015    Procedure: ESOPHAGEAL DILATION;  Surgeon: Rogene Houston, MD;  Location: [facility];  Service: Endoscopy;  Laterality: N/A;    Allergies  Allergen Reactions  . Cortisone     Rash, itch, burn    Current Outpatient Prescriptions on File Prior to Visit  Medication Sig Dispense Refill  . allopurinol (ZYLOPRIM) 300 MG tablet Take 300 mg by mouth daily.    . bisoprolol  (ZEBETA) 5 MG tablet Take 5 mg by mouth daily.    . cholecalciferol (VITAMIN D) 400 UNITS TABS tablet Take 5,000 Units by mouth.    . digoxin (LANOXIN) 0.125 MG tablet Take by mouth daily.    Marland Kitchen exenatide (BYETTA) 10 MCG/0.04ML SOPN injection Inject 10 mcg into the skin 2 (two) times daily with a meal.    . furosemide (LASIX) 40 MG tablet Take 40 mg by mouth 2 (two) times daily.    Marland Kitchen gabapentin (NEURONTIN) 300 MG capsule Take 300 mg by mouth 3 (three) times daily.    . insulin glargine (LANTUS) 100 UNIT/ML injection Inject 110 Units into the skin daily.     . isosorbide mononitrate (IMDUR) 60 MG 24 hr tablet Take 60 mg by mouth daily.    Marland Kitchen omeprazole (PRILOSEC) 40 MG capsule Take 1 capsule (40 mg total) by mouth daily. 90 capsule 3  . ondansetron (ZOFRAN) 4 MG tablet Take 4 mg by mouth every 8 (eight) hours as needed for nausea or vomiting.    . pantoprazole (PROTONIX) 40 MG tablet Take 1 tablet (40 mg total) by mouth daily. 30 tablet 5  . rosuvastatin (CRESTOR) 20 MG tablet Take 20 mg by mouth daily.    . sitaGLIPtin (JANUVIA) 100 MG tablet Take 100 mg by mouth daily.    . Wheat Dextrin (BENEFIBER DRINK MIX) PACK Take 4 g by mouth at bedtime.     No current facility-administered medications on file prior to visit.        Objective:   Physical ExamBlood pressure 142/80, pulse 84, temperature 98.4 F (36.9 C), height 5\' 2"  (1.575 m), weight 238 lb 3.2 oz (108.047 kg).  Alert and oriented. Skin warm and dry. Oral mucosa is moist.   . Sclera anicteric, conjunctivae is pink. Thyroid not enlarged. No cervical lymphadenopathy.Bilaterally wheezes. (Patient is on oxygen).  Heart regular rate and rhythm.  Abdomen is soft. Bowel sounds are positive. No hepatomegaly. No abdominal masses felt. No tenderness.  No edema to lower extremities.         Assessment & Plan:  Anemia. GI blood loss. EGD and colonoscopy did not show source of bleeding. Given capsule was cancelled in January, but she was  admitted to [facility].  Will schedule a GIven Capsule.  CBC today.  Patient advised if she become weak, to go to the ED.

## 2015-12-18 NOTE — Patient Instructions (Addendum)
Cbc Given capsule If you become weak, go to the ED.

## 2015-12-19 LAB — CBC WITH DIFFERENTIAL/PLATELET
BASOS ABS: 0 {cells}/uL (ref 0–200)
Basophils Relative: 0 %
EOS PCT: 0 %
Eosinophils Absolute: 0 cells/uL — ABNORMAL LOW (ref 15–500)
HEMATOCRIT: 35.3 % (ref 35.0–45.0)
Hemoglobin: 11 g/dL — ABNORMAL LOW (ref 11.7–15.5)
LYMPHS ABS: 2414 {cells}/uL (ref 850–3900)
LYMPHS PCT: 34 %
MCH: 27.5 pg (ref 27.0–33.0)
MCHC: 31.2 g/dL — AB (ref 32.0–36.0)
MCV: 88.3 fL (ref 80.0–100.0)
MONO ABS: 426 {cells}/uL (ref 200–950)
MPV: 10.6 fL (ref 7.5–12.5)
Monocytes Relative: 6 %
Neutro Abs: 4260 cells/uL (ref 1500–7800)
Neutrophils Relative %: 60 %
Platelets: 239 10*3/uL (ref 140–400)
RBC: 4 MIL/uL (ref 3.80–5.10)
RDW: 18.9 % — AB (ref 11.0–15.0)
WBC: 7.1 10*3/uL (ref 3.8–10.8)

## 2015-12-24 ENCOUNTER — Encounter (INDEPENDENT_AMBULATORY_CARE_PROVIDER_SITE_OTHER): Payer: Self-pay

## 2015-12-25 ENCOUNTER — Encounter (HOSPITAL_COMMUNITY): Payer: Self-pay | Admitting: *Deleted

## 2015-12-25 ENCOUNTER — Ambulatory Visit (HOSPITAL_COMMUNITY)
Admission: RE | Admit: 2015-12-25 | Discharge: 2015-12-25 | Disposition: A | Discharge status: Home / Self Care | Payer: Medicare Other | Source: Ambulatory Visit | Attending: Internal Medicine | Admitting: Internal Medicine

## 2015-12-25 ENCOUNTER — Encounter (HOSPITAL_COMMUNITY)
Admission: RE | Disposition: A | Discharge status: Home / Self Care | Payer: Self-pay | Source: Ambulatory Visit | Attending: Internal Medicine

## 2015-12-25 DIAGNOSIS — Z794 Long term (current) use of insulin: Secondary | ICD-10-CM | POA: Diagnosis not present

## 2015-12-25 DIAGNOSIS — E119 Type 2 diabetes mellitus without complications: Secondary | ICD-10-CM | POA: Diagnosis not present

## 2015-12-25 DIAGNOSIS — E7800 Pure hypercholesterolemia, unspecified: Secondary | ICD-10-CM | POA: Insufficient documentation

## 2015-12-25 DIAGNOSIS — D509 Iron deficiency anemia, unspecified: Secondary | ICD-10-CM | POA: Diagnosis present

## 2015-12-25 DIAGNOSIS — Z7901 Long term (current) use of anticoagulants: Secondary | ICD-10-CM | POA: Insufficient documentation

## 2015-12-25 DIAGNOSIS — Z952 Presence of prosthetic heart valve: Secondary | ICD-10-CM | POA: Diagnosis not present

## 2015-12-25 DIAGNOSIS — I781 Nevus, non-neoplastic: Secondary | ICD-10-CM | POA: Insufficient documentation

## 2015-12-25 DIAGNOSIS — Z79899 Other long term (current) drug therapy: Secondary | ICD-10-CM | POA: Diagnosis not present

## 2015-12-25 DIAGNOSIS — Z7982 Long term (current) use of aspirin: Secondary | ICD-10-CM | POA: Diagnosis not present

## 2015-12-25 HISTORY — PX: GIVENS CAPSULE STUDY: SHX5432

## 2015-12-25 SURGERY — IMAGING PROCEDURE, GI TRACT, INTRALUMINAL, VIA CAPSULE

## 2015-12-25 NOTE — H&P (Signed)
Rachel Suarez is an 75 y.o. female.   Chief Complaint: Patient is here for small bowel given capsule study. HPI: Patient is 75 year old Caucasian female with multiple medical problems who underwent balloon aortic valvuloplasty at [facility] in February this year. She developed melena but EGD was negative. Since then she has been hospitalized twice at [facility] in [facility] and has received total of 5 units of PRBCs(3 units earlier and 2 units 2 weeks ago). She had EGD and colonoscopy by me in July last year for dysphagia and screening purposes. Since recent EGD was negative it is felt that she may be losing blood from small bowel and therefore undergoing this study.  Past Medical History  Diagnosis Date  . Diabetes ([place])     x 30 yrs  . Heart disease   . High cholesterol   . Anemia     Past Surgical History  Procedure Laterality Date  . Cholecystectomy    . Coronary artery bypass graft    . Colonoscopy N/A 03/29/2015    Procedure: COLONOSCOPY;  Surgeon: Rogene Houston, MD;  [facility];  Service: Endoscopy;  Laterality: N/A;  1015 - moved to 1:10 - Ann to notify  . Esophagogastroduodenoscopy N/A 03/29/2015    Procedure: ESOPHAGOGASTRODUODENOSCOPY (EGD);  Surgeon: Rogene Houston, MD;  [facility];  Service: Endoscopy;  Laterality: N/A;  . Esophageal dilation N/A 03/29/2015    Procedure: ESOPHAGEAL DILATION;  Surgeon: Rogene Houston, MD;  [facility];  Service: Endoscopy;  Laterality: N/A;    History reviewed. No pertinent family history. Social History:  reports that she has never smoked. She does not have any smokeless tobacco history on file. She reports that she does not drink alcohol or use illicit drugs.  Allergies:  Allergies  Allergen Reactions  . Cortisone     Rash, itch, burn    No prescriptions prior to admission    No results found for this or any previous visit (from the past 48 hour(s)). No results found.  ROS  Height 5\' 2"   (1.575 m), weight 238 lb (107.956 kg). 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 Patient is undergoing small bowel given capsule study.  Rogene Houston, MD 12/25/2015, 4:20 PM

## 2015-12-26 ENCOUNTER — Encounter (HOSPITAL_COMMUNITY): Payer: Self-pay | Admitting: Internal Medicine

## 2016-01-02 NOTE — Op Note (Signed)
Small Bowel Givens Capsule Study Procedure date:  12/25/2015  Referring Provider:  Cleda Mccreedy, MD PCP:  Dr. Cleda Mccreedy, MD  Indication for procedure:   Patient is 75 year old Caucasian female with multiple medical problems who who underwent percutaneous aortic valve replacement in Fabry 2017 at [facility]. She had melena while on anticoagulant. EGD was unremarkable. Since then she's been hospitalized at [facility] twice and has received total of 5 units of PRBCs. Patient had colonoscopy in July 2016 with removal of multiple tubular adenomas. I therefore recommended small bowel given capsule study which is felt to be most likely source of patient's recurrent GI bleed and anemia.   Findings:    Focal mucosal edema noted on images at 1 hour 13 minutes and 51 seconds. Punctate telangiectasia noted at gastric antrum. 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  First Gastric image:  56 sec First Duodenal image: 11 min and 15 sec First Ileo-Cecal Valve image: 2 hrs 58 min and 25 sec First Cecal image: 2 hrs 58 min and 27 sec Gastric Passage time: 10 minutes Small Bowel Passage time:  2 hrs and 48 min  Summary & Recommendations:  Rapid transit of given capsule through small bowel. Few punctate gastric telangiectasia without bleeding. 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 Based on this study most likely source of patient's GI bleed as distal small bowel. Results have been reviewed with patient(12/27/2015) and she was advised to go back on low-dose aspirin. If she has frank GI bleeding she will immediately report to emergency room otherwise have H&H by Dr. Eula Fried on her next visit in 2 weeks.

## 2016-01-03 DIAGNOSIS — K5520 Angiodysplasia of colon without hemorrhage: Secondary | ICD-10-CM | POA: Diagnosis not present

## 2016-01-03 DIAGNOSIS — Z9889 Other specified postprocedural states: Secondary | ICD-10-CM

## 2016-01-03 DIAGNOSIS — K922 Gastrointestinal hemorrhage, unspecified: Secondary | ICD-10-CM

## 2016-01-03 DIAGNOSIS — D500 Iron deficiency anemia secondary to blood loss (chronic): Secondary | ICD-10-CM | POA: Diagnosis not present

## 2016-04-02 ENCOUNTER — Encounter (INDEPENDENT_AMBULATORY_CARE_PROVIDER_SITE_OTHER): Payer: Self-pay | Admitting: *Deleted

## 2016-04-20 ENCOUNTER — Other Ambulatory Visit (INDEPENDENT_AMBULATORY_CARE_PROVIDER_SITE_OTHER): Payer: Self-pay | Admitting: *Deleted

## 2016-04-20 ENCOUNTER — Telehealth (INDEPENDENT_AMBULATORY_CARE_PROVIDER_SITE_OTHER): Payer: Self-pay | Admitting: *Deleted

## 2016-04-20 ENCOUNTER — Encounter (INDEPENDENT_AMBULATORY_CARE_PROVIDER_SITE_OTHER): Payer: Self-pay | Admitting: *Deleted

## 2016-04-20 DIAGNOSIS — Z860100 Personal history of colon polyps, unspecified: Secondary | ICD-10-CM | POA: Insufficient documentation

## 2016-04-20 NOTE — Telephone Encounter (Signed)
Patient needs trilyte 

## 2016-04-20 NOTE — Telephone Encounter (Signed)
Referring MD/PCP: Eula Fried   Procedure: tcs  Reason/Indication:  Hx polyps  Has patient had this procedure before?  Yes, 2016 -- epic  If so, when, by whom and where?    Is there a family history of colon cancer?  no  Who?  What age when diagnosed?    Is patient diabetic?   yes      Does patient have prosthetic heart valve or mechanical valve?  no  Do you have a pacemaker?  no  Has patient ever had endocarditis? no  Has patient had joint replacement within last 12 months?  no  Does patient tend to be constipated or take laxatives? yes  Does patient have a history of alcohol/drug use?  no  Is patient on Coumadin, Plavix and/or Aspirin? yes  Medications: ondansetron 4 mg 1 tab tid, vit d daily, allopurinol 300 mg daily, amitiza 24 mcg daily, crestor 20 mg daily, diltiazem 180 mg daily, asa 81 mg daily, metformin 500 mg 2 tab in am & 1 tab in pm, furosemide 40 mg bid, pantoprazole 40 mg bid, lantus 70 units in am, byetta 10 units bid, dex 4, benefiber daily  Allergies: nkda  Medication Adjustment: asa 2 days, hold DM meds evening before & morning of  Procedure date & time: 05/21/16 at 245

## 2016-04-22 MED ORDER — PEG 3350-KCL-NA BICARB-NACL 420 G PO SOLR: 4000.0000 mL | Freq: Once | ORAL | 0 refills | Status: AC

## 2016-04-22 NOTE — Telephone Encounter (Signed)
agree

## 2016-05-14 ENCOUNTER — Encounter (INDEPENDENT_AMBULATORY_CARE_PROVIDER_SITE_OTHER): Payer: Self-pay | Admitting: *Deleted

## 2016-05-14 ENCOUNTER — Telehealth (INDEPENDENT_AMBULATORY_CARE_PROVIDER_SITE_OTHER): Payer: Self-pay | Admitting: *Deleted

## 2016-05-14 NOTE — Telephone Encounter (Signed)
Patient need trilyte 

## 2016-05-15 MED ORDER — PEG 3350-KCL-NA BICARB-NACL 420 G PO SOLR: 4000.0000 mL | Freq: Once | ORAL | 0 refills | Status: AC

## 2016-05-28 ENCOUNTER — Telehealth (INDEPENDENT_AMBULATORY_CARE_PROVIDER_SITE_OTHER): Payer: Self-pay | Admitting: *Deleted

## 2016-05-28 NOTE — Telephone Encounter (Signed)
Referring MD/PCP: Eula Fried   Procedure: tcs  Reason/Indication:  Hx polyps  Has patient had this procedure before?  Yes, 2016 -- epic  If so, when, by whom and where?    Is there a family history of colon cancer?  no  Who?  What age when diagnosed?    Is patient diabetic?   yes      Does patient have prosthetic heart valve or mechanical valve?  no  Do you have a pacemaker?  no  Has patient ever had endocarditis? no  Has patient had joint replacement within last 12 months?  no  Does patient tend to be constipated or take laxatives? yes  Does patient have a history of alcohol/drug use?  no  Is patient on Coumadin, Plavix and/or Aspirin? yes  Medications: ondansetron 4 mg 1 tab tid, vit d daily, allopurinol 300 mg daily, amitiza 24 mcg daily, crestor 20 mg daily, diltiazem 180 mg daily, asa 81 mg daily, metformin 500 mg 2 tab in am & 1 tab in pm, furosemide 40 mg bid, pantoprazole 40 mg bid, lantus 70 units in am, byetta 10 units bid, dex 4, benefiber daily  Allergies: see epic  Medication Adjustment: asa 2 days, hold DM meds evening before & mornin gof   Procedure date & time: 06/24/16 at 100

## 2016-05-29 NOTE — Telephone Encounter (Signed)
agree

## 2016-06-24 ENCOUNTER — Ambulatory Visit (HOSPITAL_COMMUNITY)
Admission: RE | Admit: 2016-06-24 | Discharge: 2016-06-24 | Disposition: A | Discharge status: Home / Self Care | Payer: Medicare Other | Source: Ambulatory Visit | Attending: Internal Medicine | Admitting: Internal Medicine

## 2016-06-24 ENCOUNTER — Encounter (HOSPITAL_COMMUNITY)
Admission: RE | Disposition: A | Discharge status: Home / Self Care | Payer: Self-pay | Source: Ambulatory Visit | Attending: Internal Medicine

## 2016-06-24 ENCOUNTER — Encounter (HOSPITAL_COMMUNITY): Payer: Self-pay | Admitting: *Deleted

## 2016-06-24 DIAGNOSIS — E119 Type 2 diabetes mellitus without complications: Secondary | ICD-10-CM | POA: Insufficient documentation

## 2016-06-24 DIAGNOSIS — Z7982 Long term (current) use of aspirin: Secondary | ICD-10-CM | POA: Diagnosis not present

## 2016-06-24 DIAGNOSIS — E7800 Pure hypercholesterolemia, unspecified: Secondary | ICD-10-CM | POA: Insufficient documentation

## 2016-06-24 DIAGNOSIS — Z1211 Encounter for screening for malignant neoplasm of colon: Secondary | ICD-10-CM | POA: Diagnosis present

## 2016-06-24 DIAGNOSIS — K5730 Diverticulosis of large intestine without perforation or abscess without bleeding: Secondary | ICD-10-CM | POA: Insufficient documentation

## 2016-06-24 DIAGNOSIS — Z86010 Personal history of colonic polyps: Secondary | ICD-10-CM | POA: Insufficient documentation

## 2016-06-24 DIAGNOSIS — Z800 Family history of malignant neoplasm of digestive organs: Secondary | ICD-10-CM

## 2016-06-24 DIAGNOSIS — D123 Benign neoplasm of transverse colon: Secondary | ICD-10-CM | POA: Insufficient documentation

## 2016-06-24 DIAGNOSIS — Z9981 Dependence on supplemental oxygen: Secondary | ICD-10-CM | POA: Diagnosis not present

## 2016-06-24 DIAGNOSIS — K644 Residual hemorrhoidal skin tags: Secondary | ICD-10-CM | POA: Insufficient documentation

## 2016-06-24 DIAGNOSIS — Z951 Presence of aortocoronary bypass graft: Secondary | ICD-10-CM | POA: Diagnosis not present

## 2016-06-24 DIAGNOSIS — J449 Chronic obstructive pulmonary disease, unspecified: Secondary | ICD-10-CM | POA: Insufficient documentation

## 2016-06-24 DIAGNOSIS — Z794 Long term (current) use of insulin: Secondary | ICD-10-CM | POA: Insufficient documentation

## 2016-06-24 DIAGNOSIS — Z79899 Other long term (current) drug therapy: Secondary | ICD-10-CM | POA: Insufficient documentation

## 2016-06-24 DIAGNOSIS — K621 Rectal polyp: Secondary | ICD-10-CM | POA: Diagnosis not present

## 2016-06-24 DIAGNOSIS — D128 Benign neoplasm of rectum: Secondary | ICD-10-CM | POA: Insufficient documentation

## 2016-06-24 DIAGNOSIS — Z09 Encounter for follow-up examination after completed treatment for conditions other than malignant neoplasm: Secondary | ICD-10-CM

## 2016-06-24 HISTORY — PX: COLONOSCOPY: SHX5424

## 2016-06-24 LAB — GLUCOSE, CAPILLARY: Glucose-Capillary: 139 mg/dL — ABNORMAL HIGH (ref 65–99)

## 2016-06-24 SURGERY — COLONOSCOPY
Anesthesia: Moderate Sedation

## 2016-06-24 MED ORDER — MEPERIDINE HCL 50 MG/ML IJ SOLN
INTRAMUSCULAR | Status: DC | PRN
  Administered 2016-06-24 (×2): 25 mg

## 2016-06-24 MED ORDER — STERILE WATER FOR IRRIGATION IR SOLN
Status: DC | PRN
  Administered 2016-06-24: 13:00:00

## 2016-06-24 MED ORDER — SODIUM CHLORIDE 0.9 % IV SOLN
INTRAVENOUS | Status: DC
  Administered 2016-06-24: 1000 mL via INTRAVENOUS

## 2016-06-24 MED ORDER — MIDAZOLAM HCL 5 MG/5ML IJ SOLN
INTRAMUSCULAR | Status: AC
  Filled 2016-06-24: qty 10

## 2016-06-24 MED ORDER — MEPERIDINE HCL 50 MG/ML IJ SOLN
INTRAMUSCULAR | Status: DC
Start: 2016-06-24 — End: 2016-06-24
  Filled 2016-06-24: qty 1

## 2016-06-24 MED ORDER — MIDAZOLAM HCL 5 MG/5ML IJ SOLN
INTRAMUSCULAR | Status: DC | PRN
  Administered 2016-06-24: 2 mg via INTRAVENOUS
  Administered 2016-06-24 (×2): 1 mg via INTRAVENOUS
  Administered 2016-06-24: 2 mg via INTRAVENOUS

## 2016-06-24 NOTE — Discharge Instructions (Signed)
Resume aspirin on 06/28/2016. Resume other medications and diet as before. No driving for 24 hours. Physician will call with biopsy results. Colonoscopy, Care After Refer to this sheet in the next few weeks. These instructions provide you with information on caring for yourself after your procedure. Your health care provider may also give you more specific instructions. Your treatment has been planned according to current medical practices, but problems sometimes occur. Call your health care provider if you have any problems or questions after your procedure. WHAT TO EXPECT AFTER THE PROCEDURE  After your procedure, it is typical to have the following:  A small amount of blood in your stool.  Moderate amounts of gas and mild abdominal cramping or bloating. HOME CARE INSTRUCTIONS  Do not drive, operate machinery, or sign important documents for 24 hours.  You may shower and resume your regular physical activities, but move at a slower pace for the first 24 hours.  Take frequent rest periods for the first 24 hours.  Walk around or put a warm pack on your abdomen to help reduce abdominal cramping and bloating.  Drink enough fluids to keep your urine clear or pale yellow.  You may resume your normal diet as instructed by your health care provider. Avoid heavy or fried foods that are hard to digest.  Avoid drinking alcohol for 24 hours or as instructed by your health care provider.  Only take over-the-counter or prescription medicines as directed by your health care provider.  If a tissue sample (biopsy) was taken during your procedure:  Do not take aspirin or blood thinners for 7 days, or as instructed by your health care provider.  Do not drink alcohol for 7 days, or as instructed by your health care provider.  Eat soft foods for the first 24 hours. SEEK MEDICAL CARE IF: You have persistent spotting of blood in your stool 2-3 days after the procedure. SEEK IMMEDIATE MEDICAL CARE  IF:  You have more than a small spotting of blood in your stool.  You pass large blood clots in your stool.  Your abdomen is swollen (distended).  You have nausea or vomiting.  You have a fever.  You have increasing abdominal pain that is not relieved with medicine.   This information is not intended to replace advice given to you by your health care provider. Make sure you discuss any questions you have with your health care provider.   Document Released: 04/07/2004 Document Revised: 06/14/2013 Document Reviewed: 05/01/2013 Elsevier Interactive Patient Education [employer].

## 2016-06-24 NOTE — H&P (Addendum)
Rachel Suarez is an 75 y.o. female.   Chief Complaint: Patient is here for colonoscopy. HPI: Patient is 75 year old Caucasian female with multiple medical problems including chronic diarrhea who underwent colonoscopy in July last year. She had 11 polyps removed and most of these are tubular adenomas. She had one broad-based polyp snared piecemeal from transverse colon. Patient advised to return in one year to make sure she does not have any residual polyp. She remains with intermittent diarrhea. Sigmoid colon biopsy was negative for microscopic colitis. She states she has noted right low quadrant pain the last 1 week. She does have chronic back pain. She denies rectal bleeding. She is on home O2. She says she can walk little bit before she gets short of breath. Family history significant for CRC in mother was 42 at the time of diagnosis and lived to be 44 and died of unrelated causes.   Past Medical History:  Diagnosis Date  . Anemia   . Diabetes ([place])    x 30 yrs  . Heart disease   . High cholesterol        COPD  Past Surgical History:  Procedure Laterality Date  . CHOLECYSTECTOMY    . COLONOSCOPY N/A 03/29/2015   Procedure: COLONOSCOPY;  Surgeon: Rogene Houston, MD;  Location: [facility];  Service: Endoscopy;  Laterality: N/A;  1015 - moved to 1:10 - Ann to notify  . CORONARY ARTERY BYPASS GRAFT    . ESOPHAGEAL DILATION N/A 03/29/2015   Procedure: ESOPHAGEAL DILATION;  Surgeon: Rogene Houston, MD;  Location: [facility];  Service: Endoscopy;  Laterality: N/A;  . ESOPHAGOGASTRODUODENOSCOPY N/A 03/29/2015   Procedure: ESOPHAGOGASTRODUODENOSCOPY (EGD);  Surgeon: Rogene Houston, MD;  Location: [facility];  Service: Endoscopy;  Laterality: N/A;  . GIVENS CAPSULE STUDY N/A 12/25/2015   Procedure: GIVENS CAPSULE STUDY;  Surgeon: Rogene Houston, MD;  Location: [facility];  Service: Endoscopy;  Laterality: N/A;    History reviewed. No pertinent family history. Social History:   reports that she has never smoked. She has never used smokeless tobacco. She reports that she does not drink alcohol or use drugs.  Allergies:  Allergies  Allergen Reactions  . Cortisone     Rash, itch, burn--patient states she tolerates now.    Medications Prior to Admission  Medication Sig Dispense Refill  . allopurinol (ZYLOPRIM) 300 MG tablet Take 300 mg by mouth daily.    . AMITIZA 24 MCG capsule Take 24 mcg by mouth daily.    Marland Kitchen aspirin EC 81 MG tablet Take 81 mg by mouth daily.    . bisoprolol (ZEBETA) 5 MG tablet Take 5 mg by mouth daily.    . Cholecalciferol (VITAMIN D3) 5000 units TABS Take 5,000 Units by mouth daily.    . digoxin (LANOXIN) 0.125 MG tablet Take 0.125 mg by mouth every evening.     . diltiazem (TIAZAC) 180 MG 24 hr capsule Take 180 mg by mouth daily.  10  . exenatide (BYETTA) 10 MCG/0.04ML SOPN injection Inject 10 mcg into the skin 2 (two) times daily with a meal.    . furosemide (LASIX) 40 MG tablet Take 40 mg by mouth 2 (two) times daily.    Marland Kitchen gabapentin (NEURONTIN) 300 MG capsule Take 300 mg by mouth 3 (three) times daily.    . isosorbide mononitrate (IMDUR) 60 MG 24 hr tablet Take 60 mg by mouth daily.    Marland Kitchen LANTUS SOLOSTAR 100 UNIT/ML Solostar Pen Inject 80  Units into the skin daily before breakfast.  11  . metFORMIN (GLUCOPHAGE) 500 MG tablet Take 500-1,000 mg by mouth 2 (two) times daily. 1000 mg in the morning and 500 mg in the evening.  3  . ondansetron (ZOFRAN) 4 MG tablet Take 4 mg by mouth every 8 (eight) hours as needed for nausea or vomiting.    . pantoprazole (PROTONIX) 40 MG tablet Take 1 tablet (40 mg total) by mouth daily. 30 tablet 5  . rosuvastatin (CRESTOR) 20 MG tablet Take 20 mg by mouth daily.    . sitaGLIPtin (JANUVIA) 100 MG tablet Take 100 mg by mouth daily.    . Wheat Dextrin (BENEFIBER DRINK MIX) PACK Take 4 g by mouth at bedtime.    . Diclofenac Sodium 3 % GEL Apply 1 application topically 4 (four) times daily as needed. For pain in  legs/back.      Results for orders placed or performed during the hospital encounter of 06/24/16 (from the past 48 hour(s))  Glucose, capillary     Status: Abnormal   Collection Time: 06/24/16 12:25 PM  Result Value Ref Range   Glucose-Capillary 139 (H) 65 - 99 mg/dL   No results found.  ROS  Blood pressure (!) 148/85, pulse 90, temperature 98.7 F (37.1 C), temperature source Oral, resp. rate 18, height 5\' 2"  (1.575 m), weight 240 lb (108.9 kg), SpO2 98 %. Physical Exam  Constitutional: She appears well-developed and well-nourished.  HENT:  Mouth/Throat: Oropharynx is clear and moist.  Eyes: Conjunctivae are normal. No scleral icterus.  Neck: No thyromegaly present.  Cardiovascular: Normal rate, regular rhythm and normal heart sounds.   No murmur heard. Respiratory: Effort normal and breath sounds normal.  GI:  Abdomen is full. She has long scar in upper abdomen in the middle extending to right side of umbilicus. Abdomen soft and nontender without organomegaly or masses.  Musculoskeletal: She exhibits edema (trace edema at ankle.).  Lymphadenopathy:    She has no cervical adenopathy.  Neurological: She is alert.  Skin: Skin is warm and dry.     Assessment/Plan History of multiple colonic adenomas. Family history of CRC in mother at late onset. Surveillance colonoscopy.  Hildred Laser, MD 06/24/2016, 12:48 PM

## 2016-06-24 NOTE — Op Note (Signed)
[facility] Patient Name: Rachel Suarez Procedure Date: 06/24/2016 12:48 PM MRN: [id number] Date of Birth: 06/16/1941 Attending MD: Hildred Laser , MD CSN: [id number] Age: 75 Admit Type: Outpatient Procedure:                Colonoscopy Indications:              High risk colon cancer surveillance: Personal                            history of colonic polyps, Family history of colon                            cancer in a first-degree relative Providers:                Hildred Laser, MD, Janeece Riggers, RN, Isabella Stalling,                            Technician Referring MD:             Cleda Mccreedy, MD Medicines:                Meperidine 50 mg IV, Midazolam 6 mg IV Complications:            No immediate complications. Estimated Blood Loss:     Estimated blood loss was minimal. Procedure:                Pre-Anesthesia Assessment:                           - Prior to the procedure, a History and Physical                            was performed, and patient medications and                            allergies were reviewed. The patient's tolerance of                            previous anesthesia was also reviewed. The risks                            and benefits of the procedure and the sedation                            options and risks were discussed with the patient.                            All questions were answered, and informed consent                            was obtained. Prior Anticoagulants: The patient                            last took aspirin 2 days prior to the procedure.  ASA Grade Assessment: III - A patient with severe                            systemic disease. After reviewing the risks and                            benefits, the patient was deemed in satisfactory                            condition to undergo the procedure.                           After obtaining informed consent, the colonoscope   was passed under direct vision. Throughout the                            procedure, the patient's blood pressure, pulse, and                            oxygen saturations were monitored continuously. The                            EC-[id number] ([id number]) scope was introduced through                            the anus and advanced to the the cecum, identified                            by appendiceal orifice and ileocecal valve. The                            colonoscopy was performed without difficulty. The                            patient tolerated the procedure well. The quality                            of the bowel preparation was fair. The ileocecal                            valve, the appendiceal orifice and the rectum were                            photographed. Scope In: 1:02:38 PM Scope Out: 1:37:29 PM Scope Withdrawal Time: 0 hours 21 minutes 42 seconds  Total Procedure Duration: 0 hours 34 minutes 51 seconds  Findings:      Three sessile polyps were found in the rectum, transverse colon and       hepatic flexure. The polyps were 5 to 7 mm in size. These polyps were       removed with a cold snare. Resection and retrieval were complete. The       pathology specimen was placed into Bottle Number 1.      A 3 mm polyp was found in the transverse colon. The polyp was sessile.  Biopsies were taken with a cold forceps for histology. The pathology       specimen was placed into Bottle Number 1.      A few small-mouthed diverticula were found in the sigmoid colon.      External hemorrhoids were found during retroflexion. The hemorrhoids       were small. Impression:               - Preparation of the colon was fair.                           - Three 5 to 7 mm polyps in the rectum, in the                            transverse colon and at the hepatic flexure,                            removed with a cold snare. Resected and retrieved.                           - One 3 mm  polyp in the transverse colon. Biopsied.                           - Diverticulosis in the sigmoid colon.                           - External hemorrhoids. Moderate Sedation:      Moderate (conscious) sedation was administered by the endoscopy nurse       and supervised by the endoscopist. The following parameters were       monitored: oxygen saturation, heart rate, blood pressure, CO2       capnography and response to care. Total physician intraservice time was       39 minutes. Recommendation:           - Patient has a contact number available for                            emergencies. The signs and symptoms of potential                            delayed complications were discussed with the                            patient. Return to normal activities tomorrow.                            Written discharge instructions were provided to the                            patient.                           - Resume previous diet today.                           - Resume aspirin at prior dose  in 3 days.                           - Continue present medications.                           - Await pathology results.                           - No recommendation at this time regarding repeat                            colonoscopy. Procedure Code(s):        --- Professional ---                           [id number], Colonoscopy, flexible; with removal of                            tumor(s), polyp(s), or other lesion(s) by snare                            technique                           45380, 59, Colonoscopy, flexible; with biopsy,                            single or multiple                           99152, Moderate sedation services provided by the                            same physician or other qualified health care                            professional performing the diagnostic or                            therapeutic service that the sedation supports,                             requiring the presence of an independent trained                            observer to assist in the monitoring of the                            patient's level of consciousness and physiological                            status; initial 15 minutes of intraservice time,                            patient age 50 years or older  78938, Moderate sedation services; each additional                            15 minutes intraservice time                           99153, Moderate sedation services; each additional                            15 minutes intraservice time Diagnosis Code(s):        --- Professional ---                           Z86.010, Personal history of colonic polyps                           K64.4, Residual hemorrhoidal skin tags                           K62.1, Rectal polyp                           D12.3, Benign neoplasm of transverse colon (hepatic                            flexure or splenic flexure)                           Z80.0, Family history of malignant neoplasm of                            digestive organs                           K57.30, Diverticulosis of large intestine without                            perforation or abscess without bleeding CPT copyright 2016 [employer]. All rights reserved. The codes documented in this report are preliminary and upon coder review may  be revised to meet current compliance requirements. Hildred Laser, MD Hildred Laser, MD 06/24/2016 1:52:53 PM This report has been signed electronically. Number of Addenda: 0

## 2016-06-26 ENCOUNTER — Encounter (HOSPITAL_COMMUNITY): Payer: Self-pay | Admitting: Internal Medicine

## 2016-07-28 DIAGNOSIS — E559 Vitamin D deficiency, unspecified: Secondary | ICD-10-CM | POA: Insufficient documentation

## 2016-12-11 DIAGNOSIS — M109 Gout, unspecified: Secondary | ICD-10-CM | POA: Insufficient documentation

## 2016-12-11 DIAGNOSIS — N183 Chronic kidney disease, stage 3 unspecified: Secondary | ICD-10-CM

## 2016-12-11 HISTORY — DX: Chronic kidney disease, stage 3 unspecified: N18.30

## 2017-04-22 ENCOUNTER — Telehealth: Payer: Self-pay | Admitting: Family Medicine

## 2017-04-22 NOTE — Telephone Encounter (Signed)
Patient left message on nurse line to confirm appt. Please call [phone]

## 2017-04-22 NOTE — Telephone Encounter (Signed)
I tried to call patient back number is busy

## 2017-05-25 ENCOUNTER — Other Ambulatory Visit: Payer: Self-pay | Admitting: Family Medicine

## 2017-05-25 ENCOUNTER — Encounter: Payer: Self-pay | Admitting: Family Medicine

## 2017-05-25 ENCOUNTER — Ambulatory Visit (INDEPENDENT_AMBULATORY_CARE_PROVIDER_SITE_OTHER): Payer: Medicare Other | Admitting: Family Medicine

## 2017-05-25 VITALS — BP 130/68 | HR 88 | Temp 97.9°F | Resp 20 | Ht 62.0 in | Wt 232.1 lb

## 2017-05-25 DIAGNOSIS — I10 Essential (primary) hypertension: Secondary | ICD-10-CM

## 2017-05-25 DIAGNOSIS — Z23 Encounter for immunization: Secondary | ICD-10-CM | POA: Diagnosis not present

## 2017-05-25 DIAGNOSIS — Z794 Long term (current) use of insulin: Secondary | ICD-10-CM

## 2017-05-25 DIAGNOSIS — E7800 Pure hypercholesterolemia, unspecified: Secondary | ICD-10-CM | POA: Diagnosis not present

## 2017-05-25 DIAGNOSIS — Z9981 Dependence on supplemental oxygen: Secondary | ICD-10-CM | POA: Diagnosis not present

## 2017-05-25 DIAGNOSIS — E1121 Type 2 diabetes mellitus with diabetic nephropathy: Secondary | ICD-10-CM | POA: Diagnosis not present

## 2017-05-25 NOTE — Patient Instructions (Addendum)
Need old records Get blood work today Have your pharmacy call us with updated medicine list Let me know if you need refills  See me in one month for PE Call sooner for problems

## 2017-05-25 NOTE — Progress Notes (Signed)
Chief Complaint  Patient presents with  . COPD  new patient Many complicated medical problems Cannot tell me her medicines accurately Insulin  Dependent diabetic with kidney disease.  Unknown last A1c Hypertensive, controlled On a statin for cholesterol Is oxygen dependent for heart and lung disease. Has had a CABG and then an aortic valve replacement Poor historian, accompanied by a family friend.  Never married, no children No exercise, morbid obesity Lives alone and cooks and cares for herself   Patient Active Problem List   Diagnosis Date Noted  . CKD (chronic kidney disease) stage 3, GFR 30-59 ml/min 12/11/2016  . Gout 12/11/2016  . Vitamin D deficiency 07/28/2016  . Hx of colonic polyps 04/20/2016  . Anemia 12/18/2015  . 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 Heart disease 03/06/2015  . DM2 (diabetes mellitus, type 2) ([place]) 03/06/2015  . GERD (gastroesophageal reflux disease) 03/06/2015  . Family hx of colon cancer 03/06/2015  . Loss of weight 03/06/2015  . High cholesterol 03/06/2015  . Congestive heart failure (CHF) ([place]) 09/21/2011  . Essential hypertension 09/21/2011    Outpatient Encounter Prescriptions as of 05/25/2017  Medication Sig  . albuterol (PROVENTIL HFA;VENTOLIN HFA) 108 (90 Base) MCG/ACT inhaler Inhale into the lungs.  Marland Kitchen albuterol (PROVENTIL) (2.5 MG/3ML) 0.083% nebulizer solution INHALE THE CONTENTS OF 1 VIAL EVERY SIX HOURS  . allopurinol (ZYLOPRIM) 300 MG tablet Take 300 mg by mouth daily.  . AMITIZA 24 MCG capsule Take 24 mcg by mouth daily.  Marland Kitchen aspirin EC 81 MG tablet Take 1 tablet (81 mg total) by mouth daily.  . budesonide-formoterol (SYMBICORT) 80-4.5 MCG/ACT inhaler Inhale 2 puffs into the lungs 2 (two) times daily.  Marland Kitchen diltiazem (TIAZAC) 180 MG 24 hr capsule Take 180 mg by mouth daily.  Marland Kitchen exenatide (BYETTA) 10 MCG/0.04ML SOPN injection Inject 10 mcg into the skin 2 (two) times daily with a meal.  .  furosemide (LASIX) 40 MG tablet Take 40 mg by mouth 2 (two) times daily.  Marland Kitchen gabapentin (NEURONTIN) 300 MG capsule Take 300 mg by mouth 3 (three) times daily.  Marland Kitchen LANTUS SOLOSTAR 100 UNIT/ML Solostar Pen Inject 80 Units into the skin daily before breakfast.  . magnesium oxide (MAG-OX) 400 MG tablet Take 400 mg by mouth daily.  . bisoprolol (ZEBETA) 5 MG tablet Take 5 mg by mouth daily.  . Cholecalciferol (VITAMIN D3) 5000 units TABS Take 5,000 Units by mouth daily.  . Diclofenac Sodium 3 % GEL Apply 1 application topically 4 (four) times daily as needed. For pain in legs/back.  . digoxin (LANOXIN) 0.125 MG tablet Take 0.125 mg by mouth every evening.   . isosorbide mononitrate (IMDUR) 60 MG 24 hr tablet Take 60 mg by mouth daily.  . metFORMIN (GLUCOPHAGE) 500 MG tablet Take 500-1,000 mg by mouth 2 (two) times daily. 1000 mg in the morning and 500 mg in the evening.  . ondansetron (ZOFRAN) 4 MG tablet Take 4 mg by mouth every 8 (eight) hours as needed for nausea or vomiting.  . pantoprazole (PROTONIX) 40 MG tablet Take 1 tablet (40 mg total) by mouth daily.  . rosuvastatin (CRESTOR) 20 MG tablet Take 20 mg by mouth daily.  . sitaGLIPtin (JANUVIA) 100 MG tablet Take 100 mg by mouth daily.  . Wheat Dextrin (BENEFIBER DRINK MIX) PACK Take 4 g by mouth at bedtime.   No facility-administered encounter medications on file as of 05/25/2017.     Past Medical History:  Diagnosis Date  .  Allergy   . Anemia   . Arthritis   . Asthma   . Cataract   . CHF (congestive heart failure) ([place])   . CKD (chronic kidney disease) stage 3, GFR 30-59 ml/min 12/11/2016  . COPD (chronic obstructive pulmonary disease) ([place])   . Diabetes ([place])    x 30 yrs  . Emphysema of lung ([place])   . Essential hypertension 09/21/2011  . GERD (gastroesophageal reflux disease)   . Heart disease   . High cholesterol   . Oxygen deficiency     Past Surgical History:  Procedure Laterality Date  . AORTIC VALVE REPLACEMENT    .  APPENDECTOMY    . CHOLECYSTECTOMY    . COLONOSCOPY N/A 03/29/2015   Procedure: COLONOSCOPY;  Surgeon: Rogene Houston, MD;  [facility];  Service: Endoscopy;  Laterality: N/A;  1015 - moved to 1:10 - Ann to notify  . COLONOSCOPY N/A 06/24/2016   Procedure: COLONOSCOPY;  Surgeon: Rogene Houston, MD;  [facility];  Service: Endoscopy;  Laterality: N/A;  245 - moved to 10/18 @ 1:00 - Ann to notify pt  . CORONARY ARTERY BYPASS GRAFT  1993  . ESOPHAGEAL DILATION N/A 03/29/2015   Procedure: ESOPHAGEAL DILATION;  Surgeon: Rogene Houston, MD;  [facility];  Service: Endoscopy;  Laterality: N/A;  . ESOPHAGOGASTRODUODENOSCOPY N/A 03/29/2015   Procedure: ESOPHAGOGASTRODUODENOSCOPY (EGD);  Surgeon: Rogene Houston, MD;  [facility];  Service: Endoscopy;  Laterality: N/A;  . GIVENS CAPSULE STUDY N/A 12/25/2015   Procedure: GIVENS CAPSULE STUDY;  Surgeon: Rogene Houston, MD;  [facility];  Service: Endoscopy;  Laterality: N/A;    Social History   Social History  . Marital status: Single    Spouse name: N/A  . Number of children: 0  . Years of education: 10   Occupational History  . retired    Social History Main Topics  . Smoking status: Never Smoker  . Smokeless tobacco: Never Used  . Alcohol use No  . Drug use: No  . Sexual activity: No   Other Topics Concern  . Not on file   Social History Narrative   Lives in own home   Often nieces or nephews come to stay   Has a "little dog"- pit bull       Family History  Problem Relation Age of Onset  . Heart disease Mother   . Arthritis Mother   . Asthma Mother   . Diabetes Mother   . Heart disease Father   . Arthritis Father   . Diabetes Father   . Heart disease Brother   . Pneumonia Brother     Review of Systems  Constitutional: Positive for malaise/fatigue. Negative for chills, fever and weight loss.  HENT: Negative for congestion and hearing loss.   Eyes: Negative for  blurred vision and pain.       Regular eye exams  Respiratory: Positive for shortness of breath. Negative for cough.   Cardiovascular: Positive for leg swelling. Negative for chest pain.       On diuretic  Gastrointestinal: Negative for abdominal pain, constipation, diarrhea and heartburn.  Genitourinary: Negative for dysuria and frequency.  Musculoskeletal: Negative for falls, joint pain and myalgias.  Neurological: Negative for dizziness, seizures and headaches.  Psychiatric/Behavioral: Negative for depression. The patient is not nervous/anxious and does not have insomnia.     BP 130/68 (BP Location: Right Wrist, Patient Position: Sitting, Cuff Size: Normal)  Pulse 88   Temp 97.9 F (36.6 C) (Temporal)   Resp 20   Ht 5\' 2"  (1.575 m)   Wt 232 lb 1.9 oz (105.3 kg)   SpO2 97% Comment: o2 2 lpm via n/c  BMI 42.46 kg/m   Physical Exam  Constitutional: She is oriented to person, place, and time. She appears well-developed and well-nourished.  Morbid obesity  HENT:  Head: Normocephalic and atraumatic.  Eyes: Pupils are equal, round, and reactive to light. Conjunctivae are normal.  Neck: Normal range of motion. Neck supple.  Cardiovascular: Normal rate, regular rhythm and normal heart sounds.   Pulmonary/Chest: Effort normal and breath sounds normal. No respiratory distress.  Abdominal: Soft. Bowel sounds are normal. She exhibits distension.  Abdominal hernias, large, soft, irregular appearance  Musculoskeletal: Normal range of motion. She exhibits no edema.  Neurological: She is alert and oriented to person, place, and time.  Gait normal  Skin: Skin is warm and dry.  Psychiatric: She has a normal mood and affect. Her behavior is normal. Thought content normal.  Nursing note and vitals reviewed.   1. Need for influenza vaccination - Flu Vaccine QUAD 36+ mos IM  2. Type 2 diabetes mellitus with diabetic nephropathy, with long-term current use of insulin (HCC) Uncertain  control  3. High cholesterol By history  4. Essential hypertension Controlled  5. Morbid Obesity  6. Oxygen dependent  Patient Instructions  Need old records Get blood work today Have your pharmacy call us with updated medicine list Let me know if you need refills  See me in one month Call sooner for problems   Raylene Everts, MD

## 2017-05-26 ENCOUNTER — Other Ambulatory Visit: Payer: Self-pay | Admitting: Family Medicine

## 2017-05-27 ENCOUNTER — Telehealth: Payer: Self-pay

## 2017-05-27 NOTE — Telephone Encounter (Signed)
Take ES tylenol

## 2017-05-27 NOTE — Telephone Encounter (Signed)
Pt called asking for a muscle relaxer for back pain.

## 2017-05-28 NOTE — Telephone Encounter (Signed)
Pt aware.

## 2017-05-31 ENCOUNTER — Other Ambulatory Visit: Payer: Self-pay

## 2017-05-31 MED ORDER — METOLAZONE 2.5 MG PO TABS: 2.5000 mg | ORAL_TABLET | ORAL | 3 refills | Status: DC

## 2017-06-24 ENCOUNTER — Ambulatory Visit: Payer: Medicare Other | Admitting: Family Medicine

## 2017-07-14 ENCOUNTER — Encounter: Payer: Self-pay | Admitting: Family Medicine

## 2017-07-14 ENCOUNTER — Ambulatory Visit (INDEPENDENT_AMBULATORY_CARE_PROVIDER_SITE_OTHER): Payer: Medicare Other | Admitting: Family Medicine

## 2017-07-14 ENCOUNTER — Other Ambulatory Visit: Payer: Self-pay

## 2017-07-14 DIAGNOSIS — E538 Deficiency of other specified B group vitamins: Secondary | ICD-10-CM

## 2017-07-14 DIAGNOSIS — N183 Chronic kidney disease, stage 3 unspecified: Secondary | ICD-10-CM

## 2017-07-14 DIAGNOSIS — I10 Essential (primary) hypertension: Secondary | ICD-10-CM | POA: Diagnosis not present

## 2017-07-14 DIAGNOSIS — E7800 Pure hypercholesterolemia, unspecified: Secondary | ICD-10-CM | POA: Diagnosis not present

## 2017-07-14 DIAGNOSIS — E1121 Type 2 diabetes mellitus with diabetic nephropathy: Secondary | ICD-10-CM

## 2017-07-14 DIAGNOSIS — Z794 Long term (current) use of insulin: Secondary | ICD-10-CM

## 2017-07-14 DIAGNOSIS — E559 Vitamin D deficiency, unspecified: Secondary | ICD-10-CM

## 2017-07-14 MED ORDER — DICLOFENAC SODIUM 3 % TD GEL: 1.0000 "application " | Freq: Four times a day (QID) | TRANSDERMAL | 5 refills | Status: DC | PRN

## 2017-07-14 NOTE — Progress Notes (Signed)
Chief Complaint  Patient presents with  . Follow-up   Patient is here for follow-up. She states that she has been feeling more tired.  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  She states her prior doctor had her on vitamin B12 shots to help with her energy.  I told her I would be happy to check a vitamin B12 level to see if this was indicated.  She is also due for lab work to check on the status of her diabetes hyperlipidemia vitamin D deficiency and chronic kidney disease. The patient is morbidly obese.  She weight 242 pounds a year ago, weighs 231 pounds today.  She admits she is having more difficulty eating.  She has to eat frequent small meals.  She says that her food feels "stuck" in her stomach like it will not pass through.  She has nausea fair amount.  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  I explained to her also that end-stage COPD patients have trouble with maintaining their weight. She has had her flu shot.  She has had her pneumonia shots. I inquired about a bone density test.  She states she had this a couple years back.  I do not have the records and will try to find that Colonoscopy was in 2017 and is not yet due She is here today with a woman who helps care for her, brings her to appointments  Patient Active Problem List   Diagnosis Date Noted  . Morbid obesity ([place]) 05/25/2017  . Oxygen dependent 05/25/2017  . CKD (chronic kidney disease) stage 3, GFR 30-59 ml/min (HCC) 12/11/2016  . Gout 12/11/2016  . Vitamin D deficiency 07/28/2016  . Hx of colonic polyps 04/20/2016  . Anemia 12/18/2015  . 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 Heart disease 03/06/2015  . DM2 (diabetes mellitus, type 2) ([place]) 03/06/2015  . GERD (gastroesophageal reflux disease) 03/06/2015  . Family hx of colon cancer 03/06/2015  . High cholesterol 03/06/2015  . Congestive heart failure (CHF) ([place]) 09/21/2011  . Essential hypertension 09/21/2011     Outpatient Encounter Medications as of 07/14/2017  Medication Sig  . albuterol (PROVENTIL HFA;VENTOLIN HFA) 108 (90 Base) MCG/ACT inhaler Inhale into the lungs.  Marland Kitchen albuterol (PROVENTIL) (2.5 MG/3ML) 0.083% nebulizer solution INHALE THE CONTENTS OF 1 VIAL EVERY SIX HOURS  . allopurinol (ZYLOPRIM) 300 MG tablet TAKE ONE TABLET BY MOUTH DAILY  . AMITIZA 24 MCG capsule Take 24 mcg by mouth daily.  Marland Kitchen aspirin EC 81 MG tablet Take 1 tablet (81 mg total) by mouth daily.  . bisoprolol (ZEBETA) 5 MG tablet Take 5 mg by mouth daily.  . budesonide-formoterol (SYMBICORT) 80-4.5 MCG/ACT inhaler Inhale 2 puffs into the lungs 2 (two) times daily.  . Cholecalciferol (VITAMIN D3) 5000 units TABS Take 5,000 Units by mouth daily.  . Diclofenac Sodium 3 % GEL Apply 1 application 4 (four) times daily as needed topically. For pain in legs/back.  . digoxin (LANOXIN) 0.125 MG tablet Take 0.125 mg by mouth every evening.   . diltiazem (TIAZAC) 180 MG 24 hr capsule TAKE ONE CAPSULE BY MOUTH DAILY  . exenatide (BYETTA) 10 MCG/0.04ML SOPN injection Inject 10 mcg into the skin 2 (two) times daily with a meal.  . furosemide (LASIX) 40 MG tablet Take 40 mg by mouth 2 (two) times daily.  Marland Kitchen gabapentin (NEURONTIN) 300 MG capsule TAKE ONE CAPSULE BY MOUTH AT BEDTIME  . isosorbide mononitrate (IMDUR) 60 MG 24 hr tablet  Take 60 mg by mouth daily.  Marland Kitchen LANTUS SOLOSTAR 100 UNIT/ML Solostar Pen Inject 90 Units into the skin daily before breakfast.   . lidocaine (LMX) 4 % cream APPLY TO THE AFFECTED AREA(S) AS DIRECTED  . lidocaine (XYLOCAINE) 5 % ointment APPLY 1-2 GM (2 PUMPS) FOUR TIMES DAILY TO PAIN AREA AND RUB IN WELL  . magnesium oxide (MAG-OX) 400 MG tablet Take 400 mg by mouth daily.  . metFORMIN (GLUCOPHAGE) 500 MG tablet Take 500-1,000 mg by mouth 2 (two) times daily. 1000 mg in the morning and 500 mg in the evening.  . metolazone (ZAROXOLYN) 2.5 MG tablet Take 1 tablet (2.5 mg total) by mouth every other day.  .  metoprolol tartrate (LOPRESSOR) 25 MG tablet TAKE 1/4 OF A TABLET BY MOUTH TWICE DAILY  . ondansetron (ZOFRAN) 4 MG tablet Take 4 mg by mouth every 8 (eight) hours as needed for nausea or vomiting.  . pantoprazole (PROTONIX) 40 MG tablet Take 1 tablet (40 mg total) by mouth daily.  . rosuvastatin (CRESTOR) 20 MG tablet TAKE ONE TABLET BY MOUTH DAILY (accord brand ONLY)  . sitaGLIPtin (JANUVIA) 100 MG tablet Take 100 mg by mouth daily.   No facility-administered encounter medications on file as of 07/14/2017.     No Known Allergies  Review of Systems  Constitutional: Positive for appetite change, fatigue and unexpected weight change. Negative for activity change.  HENT: Negative for congestion, dental problem, postnasal drip and rhinorrhea.   Eyes: Negative for redness and visual disturbance.  Respiratory: Positive for shortness of breath and wheezing. Negative for cough.   Cardiovascular: Positive for leg swelling. Negative for chest pain and palpitations.  Gastrointestinal: Positive for abdominal distention and nausea. Negative for abdominal pain, constipation and diarrhea.  Genitourinary: Negative for difficulty urinating, frequency and menstrual problem.  Musculoskeletal: Positive for back pain and gait problem. Negative for arthralgias.  Neurological: Negative for dizziness and headaches.  Psychiatric/Behavioral: Negative for dysphoric mood and sleep disturbance. The patient is not nervous/anxious.     BP 126/74 (BP Location: Left Wrist, Patient Position: Sitting, Cuff Size: Normal)   Pulse 80   Temp 97.8 F (36.6 C) (Temporal)   Resp 20   Ht 5\' 2"  (1.575 m)   Wt 231 lb 1.3 oz (104.8 kg)   SpO2 95% Comment: o2 2 lpm via n/c  BMI 42.27 kg/m   Physical Exam  Constitutional: She is oriented to person, place, and time. She appears well-developed and well-nourished.  Morbid obesity  HENT:  Head: Normocephalic and atraumatic.  Eyes: Conjunctivae are normal. Pupils are equal,  round, and reactive to light.  Neck: Normal range of motion. Neck supple.  Cardiovascular: Normal rate, regular rhythm and normal heart sounds.  Soft systolic murmur  Pulmonary/Chest: Effort normal. No respiratory distress.  Mild tachypnea.  Decreased breath sounds  Abdominal: Soft. Bowel sounds are normal. She exhibits distension.  Abdominal hernias, large, soft, irregular appearance  Musculoskeletal: Normal range of motion. She exhibits no edema.  Neurological: She is alert and oriented to person, place, and time.  Antalgic  Skin: Skin is warm and dry.  Psychiatric: She has a normal mood and affect. Her behavior is normal. Thought content normal.  Nursing note and vitals reviewed.   ASSESSMENT/PLAN:  1. Type 2 diabetes mellitus with diabetic nephropathy, with long-term current use of insulin (HCC) - CBC - COMPLETE METABOLIC PANEL WITH GFR - Hemoglobin A1c - Lipid panel - TSH - Urinalysis, Routine w reflex microscopic  2. High cholesterol  By history.  Compliant with statin  3. Essential hypertension Controlled  4. Vitamin B 12 deficiency By history - Vitamin B12  5. CKD (chronic kidney disease) stage 3, GFR 30-59 ml/min (HCC) Due to diabetes, obesity, hypertension  6. Vitamin D deficiency By history. - VITAMIN D 25 Hydroxy (Vit-D Deficiency, Fractures)   Patient Instructions  Need blood work today Stay as active as you can manage Eat frequent small meals No change in medicines See me every 3 months Call sooner for problems   Raylene Everts, MD

## 2017-07-14 NOTE — Patient Instructions (Signed)
Need blood work today Stay as active as you can manage Eat frequent small meals No change in medicines See me every 3 months Call sooner for problems

## 2017-07-15 ENCOUNTER — Encounter: Payer: Self-pay | Admitting: Family Medicine

## 2017-07-15 DIAGNOSIS — N184 Chronic kidney disease, stage 4 (severe): Secondary | ICD-10-CM

## 2017-07-15 LAB — COMPLETE METABOLIC PANEL WITH GFR
AG Ratio: 1.2 (calc) (ref 1.0–2.5)
ALBUMIN MSPROF: 3.7 g/dL (ref 3.6–5.1)
ALKALINE PHOSPHATASE (APISO): 94 U/L (ref 33–130)
ALT: 10 U/L (ref 6–29)
AST: 16 U/L (ref 10–35)
BUN/Creatinine Ratio: 21 (calc) (ref 6–22)
BUN: 34 mg/dL — AB (ref 7–25)
CALCIUM: 9.2 mg/dL (ref 8.6–10.4)
CO2: 37 mmol/L — AB (ref 20–32)
CREATININE: 1.65 mg/dL — AB (ref 0.60–0.93)
Chloride: 88 mmol/L — ABNORMAL LOW (ref 98–110)
GFR, EST NON AFRICAN AMERICAN: 30 mL/min/{1.73_m2} — AB (ref >=60)
GFR, Est African American: 35 mL/min/{1.73_m2} — ABNORMAL LOW (ref >=60)
GLOBULIN: 3 g/dL (ref 1.9–3.7)
GLUCOSE: 135 mg/dL (ref 65–139)
Potassium: 3.1 mmol/L — ABNORMAL LOW (ref 3.5–5.3)
SODIUM: 138 mmol/L (ref 135–146)
Total Bilirubin: 0.4 mg/dL (ref 0.2–1.2)
Total Protein: 6.7 g/dL (ref 6.1–8.1)

## 2017-07-15 LAB — LIPID PANEL
Cholesterol: 103 mg/dL (ref <200)
HDL: 48 mg/dL — ABNORMAL LOW (ref >50)
LDL Cholesterol (Calc): 30 mg/dL (calc)
Non-HDL Cholesterol (Calc): 55 mg/dL (calc) (ref <130)
Total CHOL/HDL Ratio: 2.1 (calc) (ref <5.0)
Triglycerides: 174 mg/dL — ABNORMAL HIGH (ref <150)

## 2017-07-15 LAB — URINALYSIS, ROUTINE W REFLEX MICROSCOPIC
Bacteria, UA: NONE SEEN /HPF
Bilirubin Urine: NEGATIVE
Glucose, UA: NEGATIVE
Hyaline Cast: NONE SEEN /LPF
Ketones, ur: NEGATIVE
Leukocytes, UA: NEGATIVE
Nitrite: NEGATIVE
Specific Gravity, Urine: 1.02 (ref 1.001–1.03)
pH: 6 (ref 5.0–8.0)

## 2017-07-15 LAB — HEMOGLOBIN A1C
Hgb A1c MFr Bld: 7.4 % of total Hgb — ABNORMAL HIGH (ref <5.7)
Mean Plasma Glucose: 166 (calc)
eAG (mmol/L): 9.2 (calc)

## 2017-07-15 LAB — CBC
HEMATOCRIT: 34.6 % — AB (ref 35.0–45.0)
HEMOGLOBIN: 10.8 g/dL — AB (ref 11.7–15.5)
MCH: 24.4 pg — ABNORMAL LOW (ref 27.0–33.0)
MCHC: 31.2 g/dL — ABNORMAL LOW (ref 32.0–36.0)
MCV: 78.3 fL — ABNORMAL LOW (ref 80.0–100.0)
MPV: 11.8 fL (ref 7.5–12.5)
Platelets: 278 10*3/uL (ref 140–400)
RBC: 4.42 10*6/uL (ref 3.80–5.10)
RDW: 17 % — ABNORMAL HIGH (ref 11.0–15.0)
WBC: 11.6 10*3/uL — AB (ref 3.8–10.8)

## 2017-07-15 LAB — VITAMIN D 25 HYDROXY (VIT D DEFICIENCY, FRACTURES): Vit D, 25-Hydroxy: 63 ng/mL (ref 30–100)

## 2017-07-15 LAB — VITAMIN B12: VITAMIN B 12: 625 pg/mL (ref 200–1100)

## 2017-07-15 LAB — TSH: TSH: 1.97 mIU/L (ref 0.40–4.50)

## 2017-07-16 ENCOUNTER — Other Ambulatory Visit: Payer: Self-pay | Admitting: Family Medicine

## 2017-07-20 ENCOUNTER — Telehealth: Payer: Self-pay | Admitting: Family Medicine

## 2017-07-20 ENCOUNTER — Other Ambulatory Visit: Payer: Self-pay | Admitting: Family Medicine

## 2017-07-20 MED ORDER — LIDOCAINE 5 % EX OINT: TOPICAL_OINTMENT | CUTANEOUS | 1 refills | Status: DC

## 2017-07-20 MED ORDER — UNABLE TO FIND: 2 refills | Status: DC

## 2017-07-20 NOTE — Telephone Encounter (Signed)
Patient states her prescription for her pain cream isnt being written correctly because her insurance isnt covering it. States we need to write it how Dr.Karashi rights it so insurance will pay for it.

## 2017-07-20 NOTE — Telephone Encounter (Signed)
Called and spoke to pharmacist at [facility], he is faxing me the combo. Of topical creams.

## 2017-07-20 NOTE — Telephone Encounter (Signed)
Arbie Cookey, Health Coach at [facility], left message on nurse line regarding patient. She states patient needs a refill on compounded pain cream for knees.

## 2017-07-21 ENCOUNTER — Other Ambulatory Visit: Payer: Self-pay | Admitting: Family Medicine

## 2017-07-23 ENCOUNTER — Other Ambulatory Visit: Payer: Self-pay | Admitting: Family Medicine

## 2017-07-23 NOTE — Telephone Encounter (Signed)
Seen 11 7 18

## 2017-07-27 ENCOUNTER — Telehealth: Payer: Self-pay | Admitting: Family Medicine

## 2017-07-27 NOTE — Telephone Encounter (Signed)
RETURNED PATIENTS CALL, NO ANSWER, NO INFORMATION TO LEAVE ON VM

## 2017-08-02 ENCOUNTER — Telehealth: Payer: Self-pay | Admitting: *Deleted

## 2017-08-02 ENCOUNTER — Telehealth: Payer: Self-pay | Admitting: Family Medicine

## 2017-08-02 NOTE — Telephone Encounter (Signed)
Patient left a message stating that she received a letter stating that she was being referred to a kidney specialist. She requests a call back

## 2017-08-02 NOTE — Telephone Encounter (Signed)
Cannot accommodate until next week

## 2017-08-02 NOTE — Telephone Encounter (Signed)
Patient called in to requesting cortisone shots in her knees, I sent a msg requesting information. Patient also wanted to check the status of her kidney referral. Per patient " I do not want to see anyone foreign or black and I cant go to [facility]."     Patient may need to do her own research and call me back with a physician she would like to see.

## 2017-08-02 NOTE — Telephone Encounter (Signed)
Patient called requesting to speak with Selena. Please advise

## 2017-08-02 NOTE — Telephone Encounter (Signed)
Spoke to Rachel Suarez, asking for knee injections, given an appt for Tuesday dec 4 @ 0945am. Pt aware.

## 2017-08-02 NOTE — Telephone Encounter (Signed)
Patient is requesting cortisone injections in both knees. Can we do this ? Cb#: [phone] Says she had to cancel her appt at [facility] to see her heart doctor due to the pain in her knees.

## 2017-08-05 ENCOUNTER — Telehealth: Payer: Self-pay | Admitting: Family Medicine

## 2017-08-05 NOTE — Telephone Encounter (Signed)
Spoke to Rachel Suarez, she was delivered a refill of her diltiazem , not a new medication.

## 2017-08-05 NOTE — Telephone Encounter (Signed)
Patient left message on nurse line regarding a medication sent by Dr. Meda Coffee. She states she does not want to start taking it until she talks to [facility]

## 2017-08-10 ENCOUNTER — Ambulatory Visit (INDEPENDENT_AMBULATORY_CARE_PROVIDER_SITE_OTHER): Payer: Medicare Other | Admitting: Family Medicine

## 2017-08-10 ENCOUNTER — Other Ambulatory Visit: Payer: Self-pay

## 2017-08-10 ENCOUNTER — Encounter: Payer: Self-pay | Admitting: Family Medicine

## 2017-08-10 VITALS — BP 136/78 | HR 104 | Temp 98.4°F | Resp 24 | Ht 62.0 in | Wt 231.1 lb

## 2017-08-10 DIAGNOSIS — G8929 Other chronic pain: Secondary | ICD-10-CM | POA: Diagnosis not present

## 2017-08-10 DIAGNOSIS — M25561 Pain in right knee: Secondary | ICD-10-CM

## 2017-08-10 DIAGNOSIS — M25562 Pain in left knee: Secondary | ICD-10-CM

## 2017-08-10 DIAGNOSIS — M170 Bilateral primary osteoarthritis of knee: Secondary | ICD-10-CM | POA: Diagnosis not present

## 2017-08-10 MED ORDER — METHYLPREDNISOLONE ACETATE 80 MG/ML IJ SUSP
80.0000 mg | Freq: Once | INTRAMUSCULAR | Status: AC
  Administered 2017-08-10: 80 mg via INTRA_ARTICULAR

## 2017-08-10 NOTE — Patient Instructions (Signed)
[facility] Call for problems See me every 3 months as needed knee shots

## 2017-08-10 NOTE — Progress Notes (Signed)
Patient has osteoarthritis of both knees.  She has chronic knee pain.  She benefits from cortisone injection in her knees.  She states these gave her several months relief.  She has not had cortisone injections for 7 months.  Her knees are painful, and she is here requesting shots.  She is never had complications from the shots.  We discussed the prednisone can increase her sugar temporarily.  She should be careful to check her sugars in the days following her injections.  BP 136/78 (BP Location: Right Wrist, Patient Position: Sitting, Cuff Size: Normal)   Pulse (!) 104   Temp 98.4 F (36.9 C) (Temporal)   Resp (!) 24   Ht 5\' 2"  (1.575 m)   Wt 231 lb 1.9 oz (104.8 kg)   SpO2 95% Comment: o2 2 lpm via n/c  BMI 42.27 kg/m   Time out Consent Appropriate site lateral (   right and left     )  knee prepped and marked Area infiltrated with a 1 cc 1% lidocaine wheal Knee joints injected with 3 cc of 1% lidocaine and 40 mg of DepoMedrol 80 mg Depo-Medrol total Patient tolerated procedure well Post injection instructions reviewed  Primary osteoarthritis of both knees - Plan: methylPREDNISolone acetate (DEPO-MEDROL) injection 80 mg, PR DRAIN/INJECT LARGE JOINT/BURSA  Chronic pain of both knees - Plan: PR DRAIN/INJECT LARGE JOINT/BURSA   Patient Instructions  [facility] Call for problems See me every 3 months as needed knee shots

## 2017-08-14 ENCOUNTER — Other Ambulatory Visit: Payer: Self-pay | Admitting: Family Medicine

## 2017-08-17 NOTE — Telephone Encounter (Signed)
Seen 12 4 18

## 2017-09-02 ENCOUNTER — Other Ambulatory Visit: Payer: Self-pay | Admitting: Family Medicine

## 2017-09-06 ENCOUNTER — Other Ambulatory Visit: Payer: Self-pay | Admitting: Family Medicine

## 2017-09-06 NOTE — Telephone Encounter (Signed)
Seen 12 4 18

## 2017-09-07 ENCOUNTER — Other Ambulatory Visit: Payer: Self-pay | Admitting: Family Medicine

## 2017-09-13 ENCOUNTER — Other Ambulatory Visit: Payer: Self-pay

## 2017-09-13 ENCOUNTER — Other Ambulatory Visit: Payer: Self-pay | Admitting: Family Medicine

## 2017-09-13 MED ORDER — AMITIZA 24 MCG PO CAPS: 24.0000 ug | ORAL_CAPSULE | Freq: Every day | ORAL | 1 refills | Status: DC

## 2017-09-13 NOTE — Telephone Encounter (Signed)
Seen 12 4 18

## 2017-09-23 ENCOUNTER — Telehealth: Payer: Self-pay | Admitting: Family Medicine

## 2017-09-23 ENCOUNTER — Other Ambulatory Visit: Payer: Self-pay | Admitting: Family Medicine

## 2017-09-23 NOTE — Telephone Encounter (Signed)
Pt is calling in wants to know why she cant get her Pain Cream, please call her [phone]

## 2017-09-24 MED ORDER — UNABLE TO FIND: 2 refills | Status: AC

## 2017-09-24 NOTE — Telephone Encounter (Signed)
Sent, themla aware.

## 2017-10-04 ENCOUNTER — Telehealth: Payer: Self-pay | Admitting: Family Medicine

## 2017-10-04 NOTE — Telephone Encounter (Signed)
lidocaine (XYLOCAINE) 5 % ointment 35.44 g 1 07/20/2017    Sig: APPLY 1-2 GM (2 PUMPS) FOUR TIMES DAILY TO PAIN AREA AND RUB IN WELL   Class: Print    Please call in this ointment for the patient--thanks

## 2017-10-05 MED ORDER — LIDOCAINE 5 % EX OINT: TOPICAL_OINTMENT | CUTANEOUS | 1 refills | Status: DC

## 2017-10-05 NOTE — Telephone Encounter (Signed)
Done

## 2017-10-05 NOTE — Addendum Note (Signed)
Addended by: Merceda Elks on: 10/05/2017 10:33 AM   Modules accepted: Orders

## 2017-10-13 ENCOUNTER — Ambulatory Visit (INDEPENDENT_AMBULATORY_CARE_PROVIDER_SITE_OTHER): Payer: Medicare Other | Admitting: Family Medicine

## 2017-10-13 ENCOUNTER — Encounter: Payer: Self-pay | Admitting: Family Medicine

## 2017-10-13 ENCOUNTER — Other Ambulatory Visit: Payer: Self-pay

## 2017-10-13 VITALS — BP 126/82 | HR 88 | Temp 97.5°F | Resp 28 | Ht 62.0 in | Wt 236.0 lb

## 2017-10-13 DIAGNOSIS — E7800 Pure hypercholesterolemia, unspecified: Secondary | ICD-10-CM

## 2017-10-13 DIAGNOSIS — E1121 Type 2 diabetes mellitus with diabetic nephropathy: Secondary | ICD-10-CM | POA: Diagnosis not present

## 2017-10-13 DIAGNOSIS — G8929 Other chronic pain: Secondary | ICD-10-CM

## 2017-10-13 DIAGNOSIS — Z794 Long term (current) use of insulin: Secondary | ICD-10-CM

## 2017-10-13 DIAGNOSIS — E538 Deficiency of other specified B group vitamins: Secondary | ICD-10-CM

## 2017-10-13 DIAGNOSIS — M25561 Pain in right knee: Secondary | ICD-10-CM | POA: Diagnosis not present

## 2017-10-13 DIAGNOSIS — I10 Essential (primary) hypertension: Secondary | ICD-10-CM

## 2017-10-13 DIAGNOSIS — M25562 Pain in left knee: Secondary | ICD-10-CM

## 2017-10-13 MED ORDER — DEXLANSOPRAZOLE 30 MG PO CPDR: 30.0000 mg | DELAYED_RELEASE_CAPSULE | Freq: Every day | ORAL | 3 refills | Status: DC

## 2017-10-13 MED ORDER — CYANOCOBALAMIN 1000 MCG/ML IJ SOLN
1000.0000 ug | Freq: Once | INTRAMUSCULAR | Status: AC
Start: 2017-10-13 — End: 2017-10-13
  Administered 2017-10-13: 1000 ug via INTRAMUSCULAR

## 2017-10-13 NOTE — Progress Notes (Signed)
Chief Complaint  Patient presents with  . Congestive Heart Failure    3 month f/u   Patient is here for routine follow-up. She came in in December for bilateral knee pain.  She received cortisone injections.  She states her knees have been much better since then.  She wonders when she can get another shot.  I told her no more often than every 3 months She is inactive because of her obesity, arthritis, and chronic lung disease.  She has gained a couple pounds.  This is discussed. She is good about diabetes management.  She states that her sugars have been in the 60-70 range in the morning.  Sometimes she awakens in the middle of the night feeling like her sugar is too low and she needs to eat something.  I told her that this is lower than I want her blood sugars to be, and I asked her to reduce her insulin from 80-70 units Lantus.  Her last hemoglobin A1c was 7.4. Her blood pressure is well controlled. She states that she feels well.  She thinks her Protonix has "quit working".  She asks for another medicine for her acid reflux.  Cannot use omeprazole because of her Plavix.  Will prescribe dexlansoprazole. She had a recent cardiology visit.  She states that her heart is stable and her cardiologist made no changes to her regimen.  Patient Active Problem List   Diagnosis Date Noted  . Morbid obesity ([place]) 05/25/2017  . Oxygen dependent 05/25/2017  . CKD (chronic kidney disease) stage 3, GFR 30-59 ml/min ([facility]) 12/11/2016  . Gout 12/11/2016  . Vitamin D deficiency 07/28/2016  . Hx of colonic polyps 04/20/2016  . Anemia 12/18/2015  . 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 Heart disease 03/06/2015  . DM2 (diabetes mellitus, type 2) ([place]) 03/06/2015  . GERD (gastroesophageal reflux disease) 03/06/2015  . Family hx of colon cancer 03/06/2015  . High cholesterol 03/06/2015  . Congestive heart failure (CHF) ([place]) 09/21/2011  . Essential hypertension  09/21/2011    Outpatient Encounter Medications as of 10/13/2017  Medication Sig  . albuterol (PROVENTIL HFA;VENTOLIN HFA) 108 (90 Base) MCG/ACT inhaler Inhale into the lungs.  Marland Kitchen albuterol (PROVENTIL) (2.5 MG/3ML) 0.083% nebulizer solution INHALE CONTENTS OF 1 VIAL IN NEBULIZER BY NEBULIZER EVERY 4 HOURS AS NEEDED  . allopurinol (ZYLOPRIM) 300 MG tablet TAKE ONE TABLET BY MOUTH DAILY  . aspirin EC 81 MG tablet Take 1 tablet (81 mg total) by mouth daily.  . budesonide-formoterol (SYMBICORT) 80-4.5 MCG/ACT inhaler Inhale 2 puffs into the lungs 2 (two) times daily.  . Cholecalciferol (VITAMIN D3) 5000 units TABS Take 5,000 Units by mouth daily.  . Diclofenac Sodium 3 % GEL Apply 1 application 4 (four) times daily as needed topically. For pain in legs/back.  . digoxin (LANOXIN) 0.125 MG tablet Take 0.125 mg by mouth every evening.   . diltiazem (TIAZAC) 180 MG 24 hr capsule TAKE ONE CAPSULE BY MOUTH DAILY  . exenatide (BYETTA) 10 MCG/0.04ML SOPN injection Inject 10 mcg into the skin 2 (two) times daily with a meal.  . Insulin Glargine (LANTUS SOLOSTAR) 100 UNIT/ML Solostar Pen Inject 70 Units into the skin daily at 10 pm.   . isosorbide mononitrate (IMDUR) 60 MG 24 hr tablet Take 60 mg by mouth daily.  Marland Kitchen lidocaine (XYLOCAINE) 5 % ointment APPLY 1-2 GM (2 PUMPS) FOUR TIMES DAILY TO PAIN AREA AND RUB IN WELL  . lubiprostone (AMITIZA) 24 MCG capsule  Take as directed for constipation  . magnesium oxide (MAG-OX) 400 MG tablet Take 400 mg by mouth daily.  . metFORMIN (GLUCOPHAGE) 500 MG tablet Take 2 tablets in AM and 1 tablet in PM for diabetes  . metolazone (ZAROXOLYN) 2.5 MG tablet Take 1 tablet (2.5 mg total) by mouth every other day.  . ondansetron (ZOFRAN) 4 MG tablet TAKE ONE TABLET BY MOUTH THREE TIMES DAILY AS NEEDED.  Marland Kitchen sitaGLIPtin (JANUVIA) 100 MG tablet Take 100 mg by mouth daily.  . clopidogrel (PLAVIX) 75 MG tablet Take 1 tablet once a day  . Dexlansoprazole 30 MG capsule Take 1 capsule (30  mg total) by mouth daily.  Marland Kitchen gabapentin (NEURONTIN) 300 MG capsule Take 1 capsule at bedtime once a day for burning in feet  . metoprolol tartrate (LOPRESSOR) 25 MG tablet Take by mouth.  . rosuvastatin (CRESTOR) 20 MG tablet Take 1 tablet once a day for cholesterol   No facility-administered encounter medications on file as of 10/13/2017.     No Known Allergies  Review of Systems  Constitutional: Positive for fatigue and unexpected weight change. Negative for activity change and appetite change.  HENT: Negative for congestion, dental problem, postnasal drip and rhinorrhea.   Eyes: Negative for redness and visual disturbance.  Respiratory: Positive for shortness of breath and wheezing. Negative for cough.        Chronic  Cardiovascular: Positive for leg swelling. Negative for chest pain and palpitations.  Gastrointestinal: Positive for nausea. Negative for abdominal distention, abdominal pain, constipation and diarrhea.       Nausea and heartburn  Genitourinary: Negative for difficulty urinating, frequency and menstrual problem.  Musculoskeletal: Positive for back pain and gait problem. Negative for arthralgias.       Chronic  Neurological: Negative for dizziness and headaches.  Psychiatric/Behavioral: Negative for dysphoric mood and sleep disturbance. The patient is not nervous/anxious.        Unhappy about the recent death of her dog    BP 126/82 (BP Location: Left Wrist, Patient Position: Sitting, Cuff Size: Large)   Pulse 88   Temp (!) 97.5 F (36.4 C) (Temporal)   Resp (!) 28   Ht 5\' 2"  (1.575 m)   Wt 236 lb (107 kg)   SpO2 98% Comment: o2 2 lpm via n/c  BMI 43.16 kg/m   Physical Exam  Constitutional: She is oriented to person, place, and time. She appears well-developed and well-nourished.  Morbid obesity  HENT:  Head: Normocephalic and atraumatic.  Eyes: Conjunctivae are normal. Pupils are equal, round, and reactive to light.  Neck: Normal range of motion. Neck  supple.  Cardiovascular: Normal rate, regular rhythm and normal heart sounds.  Soft systolic murmur  Pulmonary/Chest: Effort normal. No respiratory distress.  Mild tachypnea.  Decreased breath sounds  Abdominal: Soft. Bowel sounds are normal. She exhibits distension.  Abdominal hernias, large, soft, irregular appearance  Musculoskeletal: Normal range of motion. She exhibits no edema.  Neurological: She is alert and oriented to person, place, and time.  Antalgic  Skin: Skin is warm and dry.  Psychiatric: Her behavior is normal. Thought content normal.  Tearful  Nursing note and vitals reviewed.   ASSESSMENT/PLAN:  1. Vitamin B 12 deficiency Replaced - cyanocobalamin ((VITAMIN B-12)) injection 1,000 mcg  2. Chronic pain of both knees Improved after injection.  3. Type 2 diabetes mellitus with diabetic nephropathy, with long-term current use of insulin ([place]) Due for blood work.  Appears well controlled - CBC - COMPLETE METABOLIC PANEL  WITH GFR - Hemoglobin A1c - Lipid panel - Urinalysis, Routine w reflex microscopic  4. High cholesterol On statin  5. Essential hypertension Well-controlled   Patient Instructions  Your BP is great You are doing well Stay as active as you can manage Reduce the insulin Lantus to 70 U a day Let me know if your sugars go over 300 or under 100  See me in one month for knee shots See me every 3 months for regular visits Due for Eye exam    Raylene Everts, MD

## 2017-10-13 NOTE — Patient Instructions (Addendum)
Your BP is great You are doing well Stay as active as you can manage Reduce the insulin Lantus to 70 U a day Let me know if your sugars go over 300 or under 100  See me in one month for knee shots See me every 3 months for regular visits Due for Eye exam

## 2017-10-14 LAB — CBC
HCT: 37.5 % (ref 35.0–45.0)
HEMOGLOBIN: 11.9 g/dL (ref 11.7–15.5)
MCH: 24.9 pg — ABNORMAL LOW (ref 27.0–33.0)
MCHC: 31.7 g/dL — ABNORMAL LOW (ref 32.0–36.0)
MCV: 78.5 fL — ABNORMAL LOW (ref 80.0–100.0)
MPV: 12 fL (ref 7.5–12.5)
PLATELETS: 257 10*3/uL (ref 140–400)
RBC: 4.78 10*6/uL (ref 3.80–5.10)
RDW: 16 % — ABNORMAL HIGH (ref 11.0–15.0)
WBC: 14.2 10*3/uL — AB (ref 3.8–10.8)

## 2017-10-14 LAB — COMPLETE METABOLIC PANEL WITH GFR
AG RATIO: 1.4 (calc) (ref 1.0–2.5)
ALT: 26 U/L (ref 6–29)
AST: 24 U/L (ref 10–35)
Albumin: 4 g/dL (ref 3.6–5.1)
Alkaline phosphatase (APISO): 103 U/L (ref 33–130)
BUN/Creatinine Ratio: 28 (calc) — ABNORMAL HIGH (ref 6–22)
BUN: 38 mg/dL — ABNORMAL HIGH (ref 7–25)
CALCIUM: 9.8 mg/dL (ref 8.6–10.4)
CO2: 36 mmol/L — AB (ref 20–32)
Chloride: 93 mmol/L — ABNORMAL LOW (ref 98–110)
Creat: 1.38 mg/dL — ABNORMAL HIGH (ref 0.60–0.93)
GFR, EST NON AFRICAN AMERICAN: 37 mL/min/{1.73_m2} — AB (ref >=60)
GFR, Est African American: 43 mL/min/{1.73_m2} — ABNORMAL LOW (ref >=60)
GLOBULIN: 2.9 g/dL (ref 1.9–3.7)
Glucose, Bld: 78 mg/dL (ref 65–99)
POTASSIUM: 3.3 mmol/L — AB (ref 3.5–5.3)
Sodium: 140 mmol/L (ref 135–146)
Total Bilirubin: 0.3 mg/dL (ref 0.2–1.2)
Total Protein: 6.9 g/dL (ref 6.1–8.1)

## 2017-10-14 LAB — URINALYSIS, ROUTINE W REFLEX MICROSCOPIC
BACTERIA UA: NONE SEEN /HPF
Bilirubin Urine: NEGATIVE
Glucose, UA: NEGATIVE
HYALINE CAST: NONE SEEN /LPF
Hgb urine dipstick: NEGATIVE
Ketones, ur: NEGATIVE
Leukocytes, UA: NEGATIVE
Nitrite: NEGATIVE
PH: 6 (ref 5.0–8.0)
RBC / HPF: NONE SEEN /HPF (ref 0–2)
SPECIFIC GRAVITY, URINE: 1.018 (ref 1.001–1.03)

## 2017-10-14 LAB — LIPID PANEL
CHOL/HDL RATIO: 2.2 (calc) (ref <5.0)
Cholesterol: 127 mg/dL (ref <200)
HDL: 59 mg/dL (ref >50)
LDL Cholesterol (Calc): 46 mg/dL (calc)
NON-HDL CHOLESTEROL (CALC): 68 mg/dL (ref <130)
TRIGLYCERIDES: 139 mg/dL (ref <150)

## 2017-10-14 LAB — HEMOGLOBIN A1C
Hgb A1c MFr Bld: 7 % of total Hgb — ABNORMAL HIGH (ref <5.7)
MEAN PLASMA GLUCOSE: 154 (calc)
eAG (mmol/L): 8.5 (calc)

## 2017-10-18 ENCOUNTER — Encounter: Payer: Self-pay | Admitting: Family Medicine

## 2017-10-18 MED ORDER — POTASSIUM CHLORIDE CRYS ER 20 MEQ PO TBCR: 20.0000 meq | EXTENDED_RELEASE_TABLET | Freq: Every day | ORAL | 3 refills | Status: DC

## 2017-10-19 ENCOUNTER — Telehealth: Payer: Self-pay | Admitting: Family Medicine

## 2017-10-19 MED ORDER — GLUCOSE BLOOD VI STRP: ORAL_STRIP | 12 refills | Status: DC

## 2017-10-19 NOTE — Telephone Encounter (Signed)
Please call the script in for Test Strips, for the pt

## 2017-10-20 ENCOUNTER — Other Ambulatory Visit: Payer: Self-pay

## 2017-10-20 ENCOUNTER — Telehealth: Payer: Self-pay | Admitting: Family Medicine

## 2017-10-20 MED ORDER — UNABLE TO FIND: 3 refills | Status: AC

## 2017-10-20 NOTE — Telephone Encounter (Signed)
Spoke to Sravya, rx done, faxed to [facility].

## 2017-10-20 NOTE — Telephone Encounter (Signed)
Patient left message on nurse line requesting a call back from [facility] regarding some prescriptions.

## 2017-10-21 ENCOUNTER — Other Ambulatory Visit: Payer: Self-pay | Admitting: Family Medicine

## 2017-10-21 ENCOUNTER — Telehealth: Payer: Self-pay | Admitting: Family Medicine

## 2017-10-21 DIAGNOSIS — R1110 Vomiting, unspecified: Secondary | ICD-10-CM

## 2017-10-21 NOTE — Telephone Encounter (Signed)
Needs referral to GI, I will place in chart

## 2017-10-21 NOTE — Telephone Encounter (Signed)
Patient states stomach medications are not working. She is still sick on her stomach/ throwing up everytime she eats. Please advise.

## 2017-11-04 ENCOUNTER — Other Ambulatory Visit: Payer: Self-pay | Admitting: Family Medicine

## 2017-11-08 ENCOUNTER — Ambulatory Visit (INDEPENDENT_AMBULATORY_CARE_PROVIDER_SITE_OTHER): Payer: Medicare Other | Admitting: Internal Medicine

## 2017-11-10 ENCOUNTER — Telehealth: Payer: Self-pay | Admitting: Family Medicine

## 2017-11-10 ENCOUNTER — Ambulatory Visit (INDEPENDENT_AMBULATORY_CARE_PROVIDER_SITE_OTHER): Payer: Medicare Other | Admitting: Family Medicine

## 2017-11-10 ENCOUNTER — Encounter: Payer: Self-pay | Admitting: Family Medicine

## 2017-11-10 VITALS — BP 124/78 | HR 107 | Temp 97.8°F | Ht 62.0 in | Wt 232.5 lb

## 2017-11-10 DIAGNOSIS — M25561 Pain in right knee: Secondary | ICD-10-CM

## 2017-11-10 DIAGNOSIS — G8929 Other chronic pain: Secondary | ICD-10-CM

## 2017-11-10 DIAGNOSIS — M25562 Pain in left knee: Secondary | ICD-10-CM | POA: Diagnosis not present

## 2017-11-10 DIAGNOSIS — M170 Bilateral primary osteoarthritis of knee: Secondary | ICD-10-CM | POA: Diagnosis not present

## 2017-11-10 MED ORDER — ROSUVASTATIN CALCIUM 20 MG PO TABS: 20.0000 mg | ORAL_TABLET | Freq: Every day | ORAL | 1 refills | Status: AC

## 2017-11-10 MED ORDER — METHYLPREDNISOLONE ACETATE 80 MG/ML IJ SUSP
80.0000 mg | Freq: Once | INTRAMUSCULAR | Status: AC
  Administered 2017-11-10: 80 mg via INTRA_ARTICULAR

## 2017-11-10 MED ORDER — PANTOPRAZOLE SODIUM 40 MG PO TBEC: 40.0000 mg | DELAYED_RELEASE_TABLET | Freq: Every day | ORAL | 3 refills | Status: DC

## 2017-11-10 NOTE — Progress Notes (Signed)
Patient is here for repeat knee injections. 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 She gets good results from the injections that last for about 3 months. I discussed with him that I will not be here in 3 months to inject her knees again.  We discussed in the choice would be for her to get Visco supplementation.  She has heard about this and is interested.  We will refer her to an orthopedic surgeon for follow-up. She has had no falls or injuries.  No worsening of condition.  Her diabetes is well controlled.  She does not generally have much of a sugar bump from the shots.  Time out Consent Appropriate site lateral (   Right and left     )  knee prepped and marked Each Area infiltrated with a 1 cc 1% lidocaine wheal Each Knee joint injected with 3 cc of 1% lidocaine and 80 mg of DepoMedrol Patient tolerated procedure well Post injection instructions reviewed

## 2017-11-10 NOTE — Telephone Encounter (Signed)
Please advise, Brand name is not going to be covered, would you like a PA to be started or switch to something else?

## 2017-11-10 NOTE — Patient Instructions (Signed)
Use ice to knees Follow up with orthopedics in 3 months

## 2017-11-10 NOTE — Telephone Encounter (Signed)
Patient left message on nurse line stating that she was sent Crestor in to the pharmacy but her insurance is not paying for the brand, she is being given the generic and she needs to have the brand name to keep her cholesterol in check. She is asking that Dr. Meda Coffee call the insurance company and tell them to fill the brand name.   Callback# [phone]

## 2017-11-11 ENCOUNTER — Telehealth: Payer: Self-pay | Admitting: Family Medicine

## 2017-11-11 ENCOUNTER — Other Ambulatory Visit: Payer: Self-pay | Admitting: Family Medicine

## 2017-11-11 ENCOUNTER — Telehealth: Payer: Self-pay

## 2017-11-11 NOTE — Telephone Encounter (Signed)
Patient calling in for refill "pain cream" for her knees Uses [facility] Cb#: [phone]

## 2017-11-11 NOTE — Telephone Encounter (Signed)
Called and advised of MD note regarding the generic Crestor to stay on it. Patient understood, also questions regarding a lotion for legs, patient insurance will not cover the medication so verbal from MD to use OTC lotion more than once daily. Patient had no questions.

## 2017-11-11 NOTE — Telephone Encounter (Signed)
No.  I changed her prescription to name brand only at her request and not because there was any problem with the generic.  Generic Crestor is working very well for her with low cholesterol numbers.  Recommend she stay on her generic equivalent.

## 2017-11-11 NOTE — Telephone Encounter (Signed)
Patient called in regarding her test strip RX. States that [employer] is faxing a sheet to be filled out for her insurance to pay, advised patient I would fill it out once I received the paperwork as I had no received it yet. Patient understood, no other questions at this time.

## 2017-11-11 NOTE — Telephone Encounter (Signed)
LVM for you to call regarding RX

## 2017-11-12 NOTE — Telephone Encounter (Signed)
RX was submitted yesterday.

## 2017-11-25 ENCOUNTER — Other Ambulatory Visit: Payer: Self-pay | Admitting: Family Medicine

## 2017-12-02 ENCOUNTER — Telehealth: Payer: Self-pay

## 2017-12-02 NOTE — Telephone Encounter (Signed)
Called patient after receiving fax from her insurance regarding patients Protonix and Dexilant. Dr. Meda Coffee d/c'ed Protonix and changed patient to [place] and stated she didn't need both. Spoke to patient to verify which one she was taking. Patient stated she took [place] and was unable to tolerate it because it caused vomiting and stomach aches so she went back to the Protonix. Will make Dr. Meda Coffee aware patient stopped Dexilant due to stomach issues it caused. and went back to Protonix.

## 2017-12-03 ENCOUNTER — Other Ambulatory Visit: Payer: Self-pay | Admitting: Family Medicine

## 2017-12-30 ENCOUNTER — Ambulatory Visit (INDEPENDENT_AMBULATORY_CARE_PROVIDER_SITE_OTHER): Payer: Medicare Other | Admitting: Orthopaedic Surgery

## 2018-01-10 ENCOUNTER — Ambulatory Visit: Payer: Medicare Other | Admitting: Family Medicine

## 2018-01-12 ENCOUNTER — Telehealth: Payer: Self-pay | Admitting: Family Medicine

## 2018-01-12 ENCOUNTER — Other Ambulatory Visit: Payer: Self-pay | Admitting: Family Medicine

## 2018-01-12 NOTE — Telephone Encounter (Signed)
Please call patient and advised that she is on multiple medications which could be indicated for blood pressure or heart issues.  It appears from reviewing her chart that she has a history of cardiac issues and is also followed by cardiology.  I would recommend at this time that she continue all of her medications as directed and make sure that she schedules a visit to be seen and evaluated in our office.

## 2018-01-12 NOTE — Telephone Encounter (Signed)
Patient called in to ask why she is taking 2 different medications for her blood.  Please call her back to discuss. Cb#: [phone]

## 2018-01-12 NOTE — Telephone Encounter (Signed)
Spoke with patient and advised of message below. Reminded patient to keep appt with Dr.Hagler on 01/20/18 and to bring ALL bottles of medication with her to visit. She verbalized understanding.

## 2018-01-12 NOTE — Telephone Encounter (Signed)
Spoke with patient regarding medications for "blood". Patient stated she was taking 2 medications for high blood pressure but they were the same name. I asked her to spell the name of the medications. It was Cartia XT 180 mg 1 capsule po QD. She told me the colors of the capsules and they were different. I explained they could be different based on the manufactures. Patient has been taking (2) capsules by mouth every morning. I told patient to take only 1 capsule by mouth every morning and I would let Dr.Hagler know. She verbalized understanding.

## 2018-01-20 ENCOUNTER — Encounter: Payer: Self-pay | Admitting: Family Medicine

## 2018-01-20 ENCOUNTER — Ambulatory Visit (INDEPENDENT_AMBULATORY_CARE_PROVIDER_SITE_OTHER): Payer: Medicare Other | Admitting: Family Medicine

## 2018-01-20 ENCOUNTER — Other Ambulatory Visit: Payer: Self-pay

## 2018-01-20 VITALS — BP 134/70 | HR 85 | Temp 98.6°F | Resp 18 | Ht 62.0 in | Wt 233.1 lb

## 2018-01-20 DIAGNOSIS — Z794 Long term (current) use of insulin: Secondary | ICD-10-CM

## 2018-01-20 DIAGNOSIS — K5900 Constipation, unspecified: Secondary | ICD-10-CM | POA: Diagnosis not present

## 2018-01-20 DIAGNOSIS — M170 Bilateral primary osteoarthritis of knee: Secondary | ICD-10-CM

## 2018-01-20 DIAGNOSIS — E1121 Type 2 diabetes mellitus with diabetic nephropathy: Secondary | ICD-10-CM

## 2018-01-20 DIAGNOSIS — R5383 Other fatigue: Secondary | ICD-10-CM

## 2018-01-20 DIAGNOSIS — D509 Iron deficiency anemia, unspecified: Secondary | ICD-10-CM | POA: Diagnosis not present

## 2018-01-20 MED ORDER — DOCUSATE SODIUM 100 MG PO CAPS: 100.0000 mg | ORAL_CAPSULE | Freq: Two times a day (BID) | ORAL | 0 refills | Status: DC | PRN

## 2018-01-20 NOTE — Progress Notes (Signed)
Patient ID: Rachel Suarez, female    DOB: August 31, 1941, 77 y.o.   MRN: [id number]  Chief Complaint  Patient presents with  . Establish Care  . Heart Problem  . Congestive Heart Failure    Allergies Patient has no known allergies.  Subjective:   Rachel Suarez is a 77 y.o. female who presents to [facility] today.  HPI Rachel Suarez presents today for an office visit to establish care.  She is previously been seen by Dr. Meda Coffee.  She is accompanied by her sister who lives in [place].  Rachel Suarez was seen by multiple specialist physicians.  She does receive most of her care at [facility].  She reports that she is followed by cardiology and gastroenterology at [facility]  She is followed in nephrology by Dr. Lowanda Foster in [place], [place].  She reports she used to have an endocrinologist at [facility] but has just been getting her diabetes followed by Dr. Meda Coffee.  She is on home oxygen and multiple medications for her breathing.  She uses inhalers on a daily basis.  She reports she has been on oxygen for quite some time.  She reports she has never smoked.  She tells me she does not have COPD despite it being in her past medical history.    She presents today for evaluation of her knee pain.  She reports that Dr. Meda Coffee is giving her shots in her knees before.  She would like a referral to orthopedics.  She reports that she has had chronic bilateral knee pain.  She had a shot back in March 2019.  She reports that her knee pain is not terrible at this time but they do bother her.  She has no redness in her joints.  She has no reported history of trauma but a diagnosis of osteoarthritis.  She does not complain of gout pain at this time.  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  She reports that she is always somewhat short of breath and gets winded with exertion.  She uses her oxygen all the time.   She denies any chest pain.  No palpitations.  She reports she is compliant with her medication but does not bring them all in today and cannot answer questions regarding her medication.  She reports her weight is been stable.  Her mood is good  Per her report.  She does report that she gets constipated.  She reports her stool is hard in quality and reports straining with her stool.  Reports water intake is limited secondary to CHF.  She denies any melena or bright red blood per rectum.  No abdominal pain.  Has not had any weight loss.  No history of thyroid disorder.     Past Medical History:  Diagnosis Date  . Allergy   . Anemia   . Arthritis   . Asthma   . Cataract   . CHF (congestive heart failure) ([place])   . CKD (chronic kidney disease) stage 3, GFR 30-59 ml/min (HCC) 12/11/2016  . COPD (chronic obstructive pulmonary disease) ([place])   . Diabetes ([place])    x 30 yrs  . Emphysema of lung ([place])   . Essential hypertension 09/21/2011  . GERD (gastroesophageal reflux disease)   . Heart disease   . High cholesterol   . Oxygen deficiency     Past Surgical History:  Procedure Laterality Date  .  AORTIC VALVE REPLACEMENT    . APPENDECTOMY    . CHOLECYSTECTOMY    . COLONOSCOPY N/A 03/29/2015   Procedure: COLONOSCOPY;  Surgeon: Rogene Houston, MD;  [facility];  Service: Endoscopy;  Laterality: N/A;  1015 - moved to 1:10 - Ann to notify  . COLONOSCOPY N/A 06/24/2016   Procedure: COLONOSCOPY;  Surgeon: Rogene Houston, MD;  Location: [facility];  Service: Endoscopy;  Laterality: N/A;  245 - moved to 10/18 @ 1:00 - Ann to notify pt  . CORONARY ARTERY BYPASS GRAFT  1993  . ESOPHAGEAL DILATION N/A 03/29/2015   Procedure: ESOPHAGEAL DILATION;  Surgeon: Rogene Houston, MD;  Location[facility];  Service: Endoscopy;  Laterality: N/A;  . ESOPHAGOGASTRODUODENOSCOPY N/A 03/29/2015   Procedure: ESOPHAGOGASTRODUODENOSCOPY (EGD);  Surgeon: Rogene Houston, MD;  [facility];   Service: Endoscopy;  Laterality: N/A;  . GIVENS CAPSULE STUDY N/A 12/25/2015   Procedure: GIVENS CAPSULE STUDY;  Surgeon: Rogene Houston, MD;  [facility];  Service: Endoscopy;  Laterality: N/A;    Family History  Problem Relation Age of Onset  . Heart disease Mother   . Arthritis Mother   . Asthma Mother   . Diabetes Mother   . Heart disease Father   . Arthritis Father   . Diabetes Father   . Heart disease Brother   . Pneumonia Brother      Social History   Socioeconomic History  . Marital status: Single    Spouse name: Not on file  . Number of children: 0  . Years of education: 10  . Highest education level: Not on file  Occupational History  . Occupation: retired  Scientific laboratory technician  . Financial resource strain: Not hard at all  . Food insecurity:    Worry: Never true    Inability: Never true  . Transportation needs:    Medical: No    Non-medical: No  Tobacco Use  . Smoking status: Never Smoker  . Smokeless tobacco: Never Used  Substance and Sexual Activity  . Alcohol use: No    Alcohol/week: 0.0 oz  . Drug use: No  . Sexual activity: Never  Lifestyle  . Physical activity:    Days per week: 0 days    Minutes per session: 0 min  . Stress: Not at all  Relationships  . Social connections:    Talks on phone: More than three times a week    Gets together: More than three times a week    Attends religious service: More than 4 times per year    Active member of club or organization: No    Attends meetings of clubs or organizations: Never    Relationship status: Never married  Other Topics Concern  . Not on file  Social History Narrative   Lives in own home   Often nieces or nephews come to stay   No pets   Current Outpatient Medications on File Prior to Visit  Medication Sig Dispense Refill  . albuterol (PROVENTIL HFA;VENTOLIN HFA) 108 (90 Base) MCG/ACT inhaler Inhale into the lungs.    Marland Kitchen albuterol (PROVENTIL) (2.5 MG/3ML) 0.083% nebulizer solution  INHALE CONTENTS OF 1 VIAL BY NEBULIZER EVERY 4 HOURS AS NEEDED 180 mL 1  . allopurinol (ZYLOPRIM) 300 MG tablet TAKE ONE TABLET BY MOUTH DAILY 90 tablet 3  . aspirin EC 81 MG tablet Take 1 tablet (81 mg total) by mouth daily.    . budesonide-formoterol (SYMBICORT) 80-4.5  MCG/ACT inhaler Inhale 2 puffs into the lungs 2 (two) times daily.    Marland Kitchen CARTIA XT 180 MG 24 hr capsule Take 180 mg by mouth daily.    . Cholecalciferol (VITAMIN D3) 5000 units TABS Take 5,000 Units by mouth daily.    . Diclofenac Sodium 3 % GEL Apply 1 application 4 (four) times daily as needed topically. For pain in legs/back. 100 g 5  . digoxin (LANOXIN) 0.125 MG tablet Take 0.125 mg by mouth every evening.     Marland Kitchen exenatide (BYETTA) 10 MCG/0.04ML SOPN injection Inject 10 mcg into the skin 2 (two) times daily with a meal.    . FERREX 150 150 MG capsule Take 150 mg by mouth daily.  6  . furosemide (LASIX) 40 MG tablet Take 40 mg by mouth 2 (two) times daily.  1  . glucose blood (IGLUCOSE TEST STRIPS) test strip Test twice daily Disp. Brand covered by insurance. Dx e11.9 100 each 12  . Insulin Glargine (LANTUS SOLOSTAR) 100 UNIT/ML Solostar Pen Inject 70 Units into the skin daily at 10 pm.     . isosorbide mononitrate (IMDUR) 60 MG 24 hr tablet Take 60 mg by mouth daily.    Marland Kitchen lidocaine (XYLOCAINE) 5 % ointment apply 1-2 grams (TWO pumps) 3 TO 4 times daily AS NEEDED RUB in WELL 141.76 g 0  . lubiprostone (AMITIZA) 24 MCG capsule Take as directed for constipation    . metFORMIN (GLUCOPHAGE) 500 MG tablet Take 2 tablets in AM and 1 tablet in PM for diabetes    . metolazone (ZAROXOLYN) 2.5 MG tablet Take 1 tablet (2.5 mg total) by mouth every other day. 45 tablet 3  . metoprolol tartrate (LOPRESSOR) 25 MG tablet Take by mouth.    . ondansetron (ZOFRAN) 4 MG tablet TAKE ONE TABLET BY MOUTH THREE TIMES DAILY AS NEEDED. 30 tablet 0  . potassium chloride SA (K-DUR,KLOR-CON) 20 MEQ tablet Take 1 tablet (20 mEq total) by mouth daily. 30  tablet 3  . rosuvastatin (CRESTOR) 20 MG tablet Take 1 tablet (20 mg total) by mouth daily. 90 tablet 1  . UNABLE TO FIND C-BACL-GABA-IBU-PRIL - 2%-2%-5%-2%  Cream.  Apply 1-2 GM ( 1-2 pumps) up to 4 times daily, and rub in well. 100 g 2  . UNABLE TO FIND accuchek test strips test 4 times daily, lancets test 4 times daily. Dx e11.9 200 each 3   No current facility-administered medications on file prior to visit.     Review of Systems  Constitutional: Negative for activity change, appetite change and fever.  HENT: Negative for congestion, nosebleeds, trouble swallowing and voice change.   Eyes: Negative for visual disturbance.  Respiratory: Positive for shortness of breath and wheezing. Negative for chest tightness.   Cardiovascular: Negative for chest pain, palpitations and leg swelling.  Gastrointestinal: Negative for abdominal pain, nausea and vomiting.  Genitourinary: Negative for dysuria, frequency and urgency.  Musculoskeletal: Negative for myalgias.       Knee pain.   Neurological: Negative for dizziness, seizures, syncope, speech difficulty, light-headedness and headaches.  Hematological: Negative for adenopathy.  Psychiatric/Behavioral: Negative for decreased concentration, dysphoric mood and sleep disturbance. The patient is not nervous/anxious.    Depression screen [facility] 2/9 01/20/2018 10/13/2017 08/10/2017 05/25/2017  Decreased Interest 0 0 0 0  Down, Depressed, Hopeless 0 0 0 0  PHQ - 2 Score 0 0 0 0    Objective:   BP 134/70 (BP Location: Left Arm, Patient Position: Sitting, Cuff Size: Normal)  Pulse 85   Temp 98.6 F (37 C) (Oral)   Resp 18 Comment: 2 lpm  Ht 5\' 2"  (1.575 m)   Wt 233 lb 1.9 oz (105.7 kg)   SpO2 97% Comment: 2 lpm  BMI 42.64 kg/m   Physical Exam Elderly, pleasant, obese female, in no acute distress.  Appearance older than stated age.  Mood euthymic.  Affect congruent with mood.  Heart regular rate and rhythm.  Lungs are basically clear bilaterally  without appreciated wheezes.  Respiratory rate within normal limits.  No accessory muscle use.  On 2 L of oxygen.  No swelling in lower extremities bilaterally.  Uncovered/examined skin without lesions.  Neck supple with full range of motion.  No adenopathy or thyroid palpated.  Abdomen is obese.  Positive bowel sounds.  Nondistended nontender.  Knees without appreciable edema or effusion bilaterally.  No erythema or overlying cutaneous temperature difference bilaterally.  Assessment and Plan  1. Primary osteoarthritis of both knees Referral placed to orthopedics.  Patient reports that she believes that she has an appointment with orthopedics in [place], Dr. Inda Merlin for tomorrow.  There is no evidence for this in the system.  We will call and get patient appointment with orthopedics.  This is not an urgent referral.  She reports she is doing fairly well with her arthritis at this time. - Ambulatory referral to Orthopedic Surgery  2. Type 2 diabetes mellitus with diabetic nephropathy, with long-term current use of insulin (HCC) Diet and weight loss recommended.  Check labs today.  Continue current diabetic medications. - Hemoglobin G8J - Basic metabolic panel  3. Iron deficiency anemia, unspecified iron deficiency anemia type Patient currently on iron therapy with history of iron deficiency anemia.  Uncertain etiology.  She does have renal disease but reports that she does not get erythropoietin injections for her blood.  We will check her iron panel and blood counts and determine need for oral iron.  We will also request records from nephrology.  - CBC with Differential/Platelet - Iron, TIBC and Ferritin Panel  4. Fatigue, unspecified type Patient does report fatigue.  She reports that she gets short of breath with exertion.  This is been chronic and has not changed.  She is followed by cardiology.  She has not evaluated and followed by pulmonary.  I am not able to find evidence of office visit notes  via care everywhere.  I will look through the medical record and see if there is any evidence of pulmonary evaluation.  She has no history of personal cigarette use.  She has had secondhand tobacco exposure.  Uncertain if her home oxygen is secondary to cardiac versus pulmonary manifestations or combined.  Her low energy and weight could be indicative of thyroid disorder.  Check TSH at this time, there is not been one ordered in many years. - TSH  5. Constipation, unspecified constipation type Patient does report constipation.  No associated or worrisome symptoms with this.  She reports she has been tried on amities of before with no improvement.  She reports she does have to limit her fluid intake due to the fact that she has congestive heart failure.  Will try Colace over-the-counter.  Dietary modifications also discussed with patient today. - docusate sodium (COLACE) 100 MG capsule; Take 1 capsule (100 mg total) by mouth 2 (two) times daily as needed for mild constipation.  Dispense: 10 capsule; Refill: 0  Patient will check her records at home and see if she has written information  regarding previous pulmonary evaluation. Return in about 1 month (around 02/17/2018). Caren Macadam, MD 01/21/2018

## 2018-01-21 ENCOUNTER — Other Ambulatory Visit: Payer: Self-pay | Admitting: Family Medicine

## 2018-01-21 LAB — BASIC METABOLIC PANEL
BUN/Creatinine Ratio: 22 (calc) (ref 6–22)
BUN: 24 mg/dL (ref 7–25)
CALCIUM: 9.3 mg/dL (ref 8.6–10.4)
CHLORIDE: 95 mmol/L — AB (ref 98–110)
CO2: 34 mmol/L — AB (ref 20–32)
Creat: 1.07 mg/dL — ABNORMAL HIGH (ref 0.60–0.93)
GLUCOSE: 145 mg/dL — AB (ref 65–99)
Potassium: 3.8 mmol/L (ref 3.5–5.3)
SODIUM: 138 mmol/L (ref 135–146)

## 2018-01-21 LAB — CBC WITH DIFFERENTIAL/PLATELET
BASOS ABS: 41 {cells}/uL (ref 0–200)
Basophils Relative: 0.3 %
EOS ABS: 41 {cells}/uL (ref 15–500)
Eosinophils Relative: 0.3 %
HCT: 36.1 % (ref 35.0–45.0)
Hemoglobin: 11.9 g/dL (ref 11.7–15.5)
Lymphs Abs: 3468 cells/uL (ref 850–3900)
MCH: 26.6 pg — ABNORMAL LOW (ref 27.0–33.0)
MCHC: 33 g/dL (ref 32.0–36.0)
MCV: 80.8 fL (ref 80.0–100.0)
MONOS PCT: 5.7 %
MPV: 11.6 fL (ref 7.5–12.5)
Neutro Abs: 9275 cells/uL — ABNORMAL HIGH (ref 1500–7800)
Neutrophils Relative %: 68.2 %
PLATELETS: 300 10*3/uL (ref 140–400)
RBC: 4.47 10*6/uL (ref 3.80–5.10)
RDW: 15.7 % — ABNORMAL HIGH (ref 11.0–15.0)
TOTAL LYMPHOCYTE: 25.5 %
WBC mixed population: 775 cells/uL (ref 200–950)
WBC: 13.6 10*3/uL — ABNORMAL HIGH (ref 3.8–10.8)

## 2018-01-21 LAB — IRON,TIBC AND FERRITIN PANEL
%SAT: 10 % — AB (ref 11–50)
Ferritin: 12 ng/mL — ABNORMAL LOW (ref 20–288)
Iron: 40 ug/dL — ABNORMAL LOW (ref 45–160)
TIBC: 404 ug/dL (ref 250–450)

## 2018-01-21 LAB — HEMOGLOBIN A1C
HEMOGLOBIN A1C: 7.2 %{Hb} — AB (ref <5.7)
MEAN PLASMA GLUCOSE: 160 (calc)
eAG (mmol/L): 8.9 (calc)

## 2018-01-21 LAB — TSH: TSH: 1.65 mIU/L (ref 0.40–4.50)

## 2018-01-24 ENCOUNTER — Other Ambulatory Visit: Payer: Self-pay | Admitting: Family Medicine

## 2018-01-24 ENCOUNTER — Telehealth: Payer: Self-pay | Admitting: Family Medicine

## 2018-01-24 NOTE — Telephone Encounter (Signed)
Patient needs a refill on her medication that makes her bowels move.  She said the last medication you gave her works good but she has to take it every day and she only has one pill now.  She also wanted to tell you she is not going to Dr Lorin Mercy for her knees she is going to go to Dr Case.

## 2018-01-25 ENCOUNTER — Other Ambulatory Visit: Payer: Self-pay

## 2018-01-25 DIAGNOSIS — K5900 Constipation, unspecified: Secondary | ICD-10-CM

## 2018-01-25 MED ORDER — DOCUSATE SODIUM 100 MG PO CAPS: 100.0000 mg | ORAL_CAPSULE | Freq: Every day | ORAL | 3 refills | Status: DC

## 2018-01-25 MED ORDER — DOCUSATE SODIUM 100 MG PO CAPS: 100.0000 mg | ORAL_CAPSULE | Freq: Two times a day (BID) | ORAL | 3 refills | Status: DC | PRN

## 2018-01-25 NOTE — Telephone Encounter (Signed)
This has been sent in to the pharmacy as you prescribed.

## 2018-01-25 NOTE — Telephone Encounter (Signed)
Please call in or send me refill for colace 100 mg, 1 po qd #90, 3 refills. Gwen Her. Mannie Stabile, MD

## 2018-01-26 ENCOUNTER — Telehealth: Payer: Self-pay | Admitting: Family Medicine

## 2018-01-26 NOTE — Telephone Encounter (Signed)
Left message requesting  call back.

## 2018-01-26 NOTE — Telephone Encounter (Signed)
Please call Rachel Suarez, she is calling wanting Pain Creme, but also wants to speak with the nurse  She has left 2 msgs

## 2018-01-27 ENCOUNTER — Other Ambulatory Visit: Payer: Self-pay | Admitting: Family Medicine

## 2018-01-27 ENCOUNTER — Other Ambulatory Visit: Payer: Self-pay

## 2018-01-27 ENCOUNTER — Encounter: Payer: Self-pay | Admitting: Family Medicine

## 2018-01-27 MED ORDER — DICLOFENAC SODIUM 3 % TD GEL: 1.0000 "application " | Freq: Four times a day (QID) | TRANSDERMAL | 2 refills | Status: DC | PRN

## 2018-01-27 NOTE — Telephone Encounter (Signed)
Spoke with patient and she is asking for her pain cream for her knees to be refilled. She stated her knee is swollen and painful. She uses Diclofenac gel. Please advise if you will refill this.

## 2018-01-27 NOTE — Telephone Encounter (Signed)
Yes, just put in and I will refill. Gwen Her. Mannie Stabile, MD

## 2018-01-27 NOTE — Telephone Encounter (Signed)
Pt has called in again asking to speak to the nurse--please reach out again

## 2018-01-27 NOTE — Telephone Encounter (Signed)
Diclofenac sent into [facility]. Patient notified

## 2018-01-28 ENCOUNTER — Other Ambulatory Visit: Payer: Self-pay | Admitting: Family Medicine

## 2018-02-10 ENCOUNTER — Encounter: Payer: Self-pay | Admitting: Family Medicine

## 2018-02-11 ENCOUNTER — Encounter: Payer: Self-pay | Admitting: Family Medicine

## 2018-02-17 ENCOUNTER — Other Ambulatory Visit: Payer: Self-pay | Admitting: Family Medicine

## 2018-02-17 ENCOUNTER — Ambulatory Visit (INDEPENDENT_AMBULATORY_CARE_PROVIDER_SITE_OTHER): Payer: Medicare Other | Admitting: Orthopaedic Surgery

## 2018-02-17 ENCOUNTER — Telehealth: Payer: Self-pay | Admitting: Family Medicine

## 2018-02-17 NOTE — Telephone Encounter (Signed)
Patient lvm requesting call back. No answer, lvm for her to call me back.

## 2018-02-21 ENCOUNTER — Other Ambulatory Visit: Payer: Self-pay

## 2018-02-21 MED ORDER — LIDOCAINE 5 % EX OINT: TOPICAL_OINTMENT | CUTANEOUS | 0 refills | Status: DC

## 2018-03-01 ENCOUNTER — Other Ambulatory Visit: Payer: Self-pay

## 2018-03-01 ENCOUNTER — Ambulatory Visit (HOSPITAL_COMMUNITY): Admission: RE | Admit: 2018-03-01 | Payer: Medicare Other | Source: Ambulatory Visit

## 2018-03-01 ENCOUNTER — Encounter: Payer: Self-pay | Admitting: Family Medicine

## 2018-03-01 ENCOUNTER — Ambulatory Visit (INDEPENDENT_AMBULATORY_CARE_PROVIDER_SITE_OTHER): Payer: Medicare Other | Admitting: Family Medicine

## 2018-03-01 ENCOUNTER — Ambulatory Visit (INDEPENDENT_AMBULATORY_CARE_PROVIDER_SITE_OTHER): Payer: Medicare Other | Admitting: Internal Medicine

## 2018-03-01 VITALS — BP 128/76 | HR 90 | Temp 98.4°F | Resp 18 | Ht 62.0 in | Wt 227.1 lb

## 2018-03-01 DIAGNOSIS — R1032 Left lower quadrant pain: Secondary | ICD-10-CM

## 2018-03-01 DIAGNOSIS — K5900 Constipation, unspecified: Secondary | ICD-10-CM | POA: Diagnosis not present

## 2018-03-01 MED ORDER — LINACLOTIDE 145 MCG PO CAPS: 145.0000 ug | ORAL_CAPSULE | Freq: Every day | ORAL | 2 refills | Status: DC

## 2018-03-01 NOTE — Progress Notes (Signed)
Patient ID: Rachel Suarez, female    DOB: 05-28-1941, 77 y.o.   MRN: [id number]  Chief Complaint  Patient presents with  . Medication Management    Allergies Patient has no known allergies.  Subjective:   Rachel Suarez is a 77 y.o. female who presents to [facility] today.  HPI [name] presents today for follow-up related to her constipation.  She reports that the stool softeners did not help her hardly any.  She reports that she still continues to struggle with constipation.  She reports that she has had this for years.  She does have an upcoming appointment with her gastroenterologist.  She reports that she has been struggling with constipation on a daily basis.  She reports that she needs to have a bowel movement but then she is unable to.  She reports that she did have a bowel movement this morning and she had some small pellet-like stool.  She reports that she is getting some crampy pain in her left sided lower abdominal area.  She reports that it occurs before the bowel movement and then it usually gets better.  However she is still felt the crampiness over the past couple days.  There is, nausea.  She reports she did have a couple episodes of vomiting a few days ago.  No dysuria.  Reports that in the past after she would have a bowel movement the lower abdominal pain would be resolved but she is feeling it still at this time.  She denies any fevers.  Constipation  This is a chronic problem. The current episode started more than 1 year ago. The problem has been waxing and waning since onset. Her stool frequency is 2 to 3 times per week. The stool is described as pellet like. The patient is not on a high fiber diet. She does not exercise regularly. There has been adequate water intake. Associated symptoms include abdominal pain and flatus. Pertinent negatives include no anorexia, back pain, bloating, diarrhea, difficulty urinating, fecal incontinence, fever, hematochezia,  hemorrhoids, melena, nausea, rectal pain, vomiting or weight loss. Risk factors include obesity. She has tried diet changes, enemas, fiber, laxatives and stool softeners for the symptoms. The treatment provided mild relief.    Past Medical History:  Diagnosis Date  . Allergy   . Anemia   . Arthritis   . Asthma   . Cataract   . CHF (congestive heart failure) ([place])   . CKD (chronic kidney disease) stage 3, GFR 30-59 ml/min (HCC) 12/11/2016  . COPD (chronic obstructive pulmonary disease) ([place])   . Diabetes ([place])    x 30 yrs  . Emphysema of lung ([place])   . Essential hypertension 09/21/2011  . GERD (gastroesophageal reflux disease)   . Heart disease   . High cholesterol   . Oxygen deficiency     Past Surgical History:  Procedure Laterality Date  . AORTIC VALVE REPLACEMENT    . APPENDECTOMY    . CHOLECYSTECTOMY    . COLONOSCOPY N/A 03/29/2015   Procedure: COLONOSCOPY;  Surgeon: Rogene Houston, MD;  [facility];  Service: Endoscopy;  Laterality: N/A;  1015 - moved to 1:10 - Ann to notify  . COLONOSCOPY N/A 06/24/2016   Procedure: COLONOSCOPY;  Surgeon: Rogene Houston, MD;  Location: [facility];  Service: Endoscopy;  Laterality: N/A;  245 - moved to 10/18 @ 1:00 - Ann to notify pt  . CORONARY ARTERY BYPASS GRAFT  1993  . ESOPHAGEAL  DILATION N/A 03/29/2015   Procedure: ESOPHAGEAL DILATION;  Surgeon: Rogene Houston, MD;  [facility]: [facility];  Service: Endoscopy;  Laterality: N/A;  . ESOPHAGOGASTRODUODENOSCOPY N/A 03/29/2015   Procedure: ESOPHAGOGASTRODUODENOSCOPY (EGD);  Surgeon: Rogene Houston, MD;  Location: [facility];  Service: Endoscopy;  Laterality: N/A;  . GIVENS CAPSULE STUDY N/A 12/25/2015   Procedure: GIVENS CAPSULE STUDY;  Surgeon: Rogene Houston, MD;  [facility]: [facility];  Service: Endoscopy;  Laterality: N/A;    Family History  Problem Relation Age of Onset  . Heart disease Mother   . Arthritis Mother   . Asthma Mother   . Diabetes Mother   .  Heart disease Father   . Arthritis Father   . Diabetes Father   . Heart disease Brother   . Pneumonia Brother      Social History   Socioeconomic History  . Marital status: Single    Spouse name: Not on file  . Number of children: 0  . Years of education: 10  . Highest education level: Not on file  Occupational History  . Occupation: retired  Scientific laboratory technician  . Financial resource strain: Not hard at all  . Food insecurity:    Worry: Never true    Inability: Never true  . Transportation needs:    Medical: No    Non-medical: No  Tobacco Use  . Smoking status: Never Smoker  . Smokeless tobacco: Never Used  Substance and Sexual Activity  . Alcohol use: No    Alcohol/week: 0.0 oz  . Drug use: No  . Sexual activity: Never  Lifestyle  . Physical activity:    Days per week: 0 days    Minutes per session: 0 min  . Stress: Not at all  Relationships  . Social connections:    Talks on phone: More than three times a week    Gets together: More than three times a week    Attends religious service: More than 4 times per year    Active member of club or organization: No    Attends meetings of clubs or organizations: Never    Relationship status: Never married  Other Topics Concern  . Not on file  Social History Narrative   Lives in own home   Often nieces or nephews come to stay   No pets   Current Outpatient Medications on File Prior to Visit  Medication Sig Dispense Refill  . albuterol (PROVENTIL HFA;VENTOLIN HFA) 108 (90 Base) MCG/ACT inhaler Inhale into the lungs.    Marland Kitchen albuterol (PROVENTIL) (2.5 MG/3ML) 0.083% nebulizer solution INHALE CONTENTS OF 1 VIAL BY NEBULIZER EVERY 4 HOURS AS NEEDED 180 mL 1  . allopurinol (ZYLOPRIM) 300 MG tablet TAKE ONE TABLET BY MOUTH DAILY 90 tablet 3  . aspirin EC 81 MG tablet Take 1 tablet (81 mg total) by mouth daily.    . budesonide-formoterol (SYMBICORT) 80-4.5 MCG/ACT inhaler Inhale 2 puffs into the lungs 2 (two) times daily.    Marland Kitchen  CARTIA XT 180 MG 24 hr capsule Take 180 mg by mouth daily.    . Cholecalciferol (VITAMIN D3) 5000 units TABS Take 5,000 Units by mouth daily.    . Diclofenac Sodium 3 % GEL Apply 1 application topically 4 (four) times daily as needed. For pain in legs/back. 100 g 2  . docusate sodium (COLACE) 100 MG capsule Take 1 capsule (100 mg total) by mouth daily. 90 capsule 3  . exenatide (BYETTA) 10 MCG/0.04ML SOPN injection Inject 10  mcg into the skin 2 (two) times daily with a meal.    . FERREX 150 150 MG capsule Take 150 mg by mouth daily.  6  . furosemide (LASIX) 40 MG tablet Take 40 mg by mouth 2 (two) times daily.  1  . glucose blood (IGLUCOSE TEST STRIPS) test strip Test twice daily Disp. Brand covered by insurance. Dx e11.9 100 each 12  . Insulin Glargine (LANTUS SOLOSTAR) 100 UNIT/ML Solostar Pen Inject 70 Units into the skin daily at 10 pm.     . isosorbide mononitrate (IMDUR) 60 MG 24 hr tablet Take 60 mg by mouth daily.    Marland Kitchen lidocaine (XYLOCAINE) 5 % ointment Apply 1-2 pumps up to four times daily-rub in well 141.76 g 0  . metFORMIN (GLUCOPHAGE) 500 MG tablet Take 2 tablets in AM and 1 tablet in PM for diabetes    . metolazone (ZAROXOLYN) 2.5 MG tablet Take 1 tablet (2.5 mg total) by mouth every other day. 45 tablet 3  . metoprolol tartrate (LOPRESSOR) 25 MG tablet Take by mouth.    . Multiple Vitamins-Minerals (ALIVE WOMENS ENERGY) TABS Take 1 tablet by mouth daily.    . ondansetron (ZOFRAN) 4 MG tablet TAKE ONE TABLET BY MOUTH THREE TIMES DAILY AS NEEDED 30 tablet 0  . potassium chloride SA (K-DUR,KLOR-CON) 20 MEQ tablet Take 1 tablet (20 mEq total) by mouth daily. 30 tablet 3  . rosuvastatin (CRESTOR) 20 MG tablet Take 1 tablet (20 mg total) by mouth daily. 90 tablet 1  . senna-docusate (SENOKOT-S) 8.6-50 MG tablet Take 1 tablet by mouth 2 (two) times daily.    Marland Kitchen UNABLE TO FIND C-BACL-GABA-IBU-PRIL - 2%-2%-5%-2%  Cream.  Apply 1-2 GM ( 1-2 pumps) up to 4 times daily, and rub in well. 100 g  2  . UNABLE TO FIND accuchek test strips test 4 times daily, lancets test 4 times daily. Dx e11.9 200 each 3   No current facility-administered medications on file prior to visit.     Review of Systems  Constitutional: Negative for activity change, appetite change, diaphoresis, fever, unexpected weight change and weight loss.  HENT: Negative for trouble swallowing.   Eyes: Negative for visual disturbance.  Respiratory: Negative for cough, chest tightness and shortness of breath.   Cardiovascular: Negative for chest pain, palpitations and leg swelling.  Gastrointestinal: Positive for abdominal pain, constipation and flatus. Negative for anorexia, bloating, diarrhea, hematochezia, hemorrhoids, melena, nausea, rectal pain and vomiting.  Genitourinary: Negative for difficulty urinating, dysuria, frequency and urgency.  Musculoskeletal: Negative for back pain.  Skin: Negative for rash.  Neurological: Negative for dizziness, syncope and light-headedness.  Hematological: Negative for adenopathy.  Psychiatric/Behavioral: The patient is not nervous/anxious.      Objective:   BP 128/76 (BP Location: Left Arm, Patient Position: Sitting, Cuff Size: Normal)   Pulse 90   Temp 98.4 F (36.9 C) (Temporal)   Resp 18   Ht 5\' 2"  (1.575 m)   Wt 227 lb 1.9 oz (103 kg)   SpO2 99%   BMI 41.54 kg/m   Physical Exam  Constitutional: She is oriented to person, place, and time. She appears well-developed and well-nourished.  HENT:  Head: Normocephalic and atraumatic.  Nose: Nose normal.  Mouth/Throat: Oropharynx is clear and moist.  Eyes: Pupils are equal, round, and reactive to light. Conjunctivae and EOM are normal. No scleral icterus.  Neck: Normal range of motion. Neck supple.  Cardiovascular: Normal rate, regular rhythm, normal heart sounds and intact distal pulses.  Pulmonary/Chest: Effort normal and breath sounds normal.  Abdominal: Soft. Bowel sounds are normal. She exhibits no distension.  There is no hepatosplenomegaly. There is tenderness in the left lower quadrant. There is no rigidity, no rebound, no guarding and no CVA tenderness.  Diffusely obese abdomen with large pannus.  Musculoskeletal: Normal range of motion.  Neurological: She is alert and oriented to person, place, and time.  Skin: Skin is warm and dry.  Psychiatric: She has a normal mood and affect. Her behavior is normal. Judgment and thought content normal.  Vitals reviewed.  Exam somewhat limited secondary to body habitus.  Assessment and Plan  1. Constipation, unspecified constipation type Chronic constipation.  She reports that she has been tried on stool softeners, fiber, increase water, Metamucil, MiraLAX.  She reports that she has not had any improvement in her symptoms.  She has had a colonoscopy with polyps since her diagnosis and constipation issues.  We will try Linzess at this time.  She was asked to increase her water content, this is somewhat limited secondary to her cardiac status. - linaclotide (LINZESS) 145 MCG CAPS capsule; Take 1 capsule (145 mcg total) by mouth daily before breakfast.  Dispense: 30 capsule; Refill: 2  2. Left lower quadrant pain Did discuss with patient that her left lower abdominal pain, although mild in quality could be secondary to constipation or diverticulitis.  Will obtain CT of the abdomen at this time to rule out diverticulitis.  She was counseled regarding worrisome signs and symptoms of abdominal pain and if those occur she should go to the emergency department. - CT Abdomen Pelvis W Contrast; Future -Discussed starting antibiotics but at this time we will proceed with CT of the abdomen prior to starting course of antibiotics due to chronic nature of constipation. Return in about 1 month (around 03/29/2018). Caren Macadam, MD 03/01/2018

## 2018-03-02 ENCOUNTER — Other Ambulatory Visit: Payer: Self-pay | Admitting: Family Medicine

## 2018-03-03 ENCOUNTER — Telehealth: Payer: Self-pay | Admitting: Family Medicine

## 2018-03-03 NOTE — Telephone Encounter (Signed)
Spoke to patient and advised her of Dr.Hagler's recommendations with verbal understanding.

## 2018-03-03 NOTE — Telephone Encounter (Signed)
Spoke with patient and she states that the [place] recently prescribed is working really well and she wants to know if she really needs the CT you ordered the other day when she was here for her appointment. She was unable to have it done that day because the hospital was on lock down.

## 2018-03-03 NOTE — Telephone Encounter (Signed)
Patient is requesting a returned call from you, I called her back on the # listed in her chart 3x but I kept getting the busy tone.

## 2018-03-03 NOTE — Telephone Encounter (Signed)
Please advise patient that if she is not having any abdominal pain then we can wait on the CT scan.  Please advise her that if she develops any abdominal pain, fever, nausea, vomiting, chills or any other worrisome signs and symptoms to please contact our office.  Please advise her to keep her scheduled follow-up.

## 2018-03-07 ENCOUNTER — Telehealth: Payer: Self-pay | Admitting: Family Medicine

## 2018-03-07 ENCOUNTER — Other Ambulatory Visit: Payer: Self-pay | Admitting: Family Medicine

## 2018-03-07 NOTE — Telephone Encounter (Signed)
Please call Amy at [facility] about Ms Widrig' medication

## 2018-03-08 ENCOUNTER — Telehealth: Payer: Self-pay | Admitting: Family Medicine

## 2018-03-08 NOTE — Telephone Encounter (Signed)
See previous note regarding this and my conversation with Amy at [facility].

## 2018-03-08 NOTE — Telephone Encounter (Signed)
Spoke with Amy at [facility] regarding patients Byetta. She stated that patient called requesting a refill on the medication, however, they haven't filled it since December. The patient has 3 pharmacies listed on her chart. I explained to Amy I think the patient goes between [facility] and [facility] the most. The script originally came from [facility]. She stated she would call [facility] to see if they had it and would try and get it transferred for the patient.

## 2018-03-08 NOTE — Telephone Encounter (Signed)
Patient called in to request refill for byetta  Pharmacy is [facility]. Cb# [id number]

## 2018-03-14 NOTE — Telephone Encounter (Signed)
Patient is requesting byetta medication. Says her old pcp Dr.Althiemer prescribed it to her. She ran out Thursday. She is requesting you to refill this please advise.

## 2018-03-15 ENCOUNTER — Other Ambulatory Visit: Payer: Self-pay

## 2018-03-15 MED ORDER — EXENATIDE 10 MCG/0.04ML ~~LOC~~ SOPN: 10.0000 ug | PEN_INJECTOR | Freq: Two times a day (BID) | SUBCUTANEOUS | 3 refills | Status: DC

## 2018-03-15 NOTE — Telephone Encounter (Signed)
Spoke with patient and she confirmed she takes the medication twice daily morning and night.

## 2018-03-15 NOTE — Telephone Encounter (Signed)
Refill for Byetta sent in to [facility]

## 2018-03-15 NOTE — Telephone Encounter (Signed)
I meant, would you call the pharmacy and confirm with the dose/dosing and that she has continued to be on this medication. Gwen Her. Mannie Stabile, MD

## 2018-03-15 NOTE — Telephone Encounter (Signed)
Did you confirm that she has been on byetta recently and in the past? This has not been listed on her medication list at our office. Gwen Her. Mannie Stabile, MD

## 2018-03-21 ENCOUNTER — Other Ambulatory Visit: Payer: Self-pay | Admitting: Family Medicine

## 2018-03-21 DIAGNOSIS — K5900 Constipation, unspecified: Secondary | ICD-10-CM

## 2018-03-21 NOTE — Telephone Encounter (Signed)
Patient is requesting 3 month supply of all medication refills for copay purposes and to be covered on her medications until she sees her new doctor after you leave. Cb# [id number]/ [id number]

## 2018-03-22 MED ORDER — CARTIA XT 180 MG PO CP24: 180.0000 mg | ORAL_CAPSULE | Freq: Every day | ORAL | 0 refills | Status: DC

## 2018-03-22 MED ORDER — ALLOPURINOL 300 MG PO TABS: 300.0000 mg | ORAL_TABLET | Freq: Every day | ORAL | 0 refills | Status: DC

## 2018-03-22 MED ORDER — FUROSEMIDE 40 MG PO TABS: 40.0000 mg | ORAL_TABLET | Freq: Two times a day (BID) | ORAL | 1 refills | Status: DC

## 2018-03-22 MED ORDER — LINACLOTIDE 145 MCG PO CAPS: 145.0000 ug | ORAL_CAPSULE | Freq: Every day | ORAL | 2 refills | Status: DC

## 2018-03-22 MED ORDER — BUDESONIDE-FORMOTEROL FUMARATE 80-4.5 MCG/ACT IN AERO
2.0000 | INHALATION_SPRAY | Freq: Two times a day (BID) | RESPIRATORY_TRACT | 3 refills | Status: AC
Start: 2018-03-22 — End: ?

## 2018-03-22 MED ORDER — FERREX 150 150 MG PO CAPS: 150.0000 mg | ORAL_CAPSULE | Freq: Every day | ORAL | 0 refills | Status: AC

## 2018-03-22 MED ORDER — METFORMIN HCL 500 MG PO TABS
ORAL_TABLET | ORAL | 3 refills | Status: DC
Start: 2018-03-22 — End: 2018-11-29

## 2018-03-22 MED ORDER — EXENATIDE 10 MCG/0.04ML ~~LOC~~ SOPN: 10.0000 ug | PEN_INJECTOR | Freq: Two times a day (BID) | SUBCUTANEOUS | 3 refills | Status: AC

## 2018-03-22 MED ORDER — DICLOFENAC SODIUM 3 % TD GEL: 1.0000 "application " | Freq: Four times a day (QID) | TRANSDERMAL | 2 refills | Status: DC | PRN

## 2018-03-22 MED ORDER — ISOSORBIDE MONONITRATE ER 60 MG PO TB24: 60.0000 mg | ORAL_TABLET | Freq: Every day | ORAL | 3 refills | Status: DC

## 2018-03-22 MED ORDER — INSULIN GLARGINE 100 UNIT/ML SOLOSTAR PEN: 70.0000 [IU] | PEN_INJECTOR | Freq: Every day | SUBCUTANEOUS | 3 refills | Status: DC

## 2018-03-22 MED ORDER — METOLAZONE 2.5 MG PO TABS: 2.5000 mg | ORAL_TABLET | ORAL | 3 refills | Status: DC

## 2018-03-22 MED ORDER — ALBUTEROL SULFATE (2.5 MG/3ML) 0.083% IN NEBU: INHALATION_SOLUTION | RESPIRATORY_TRACT | 3 refills | Status: AC

## 2018-03-22 MED ORDER — ALBUTEROL SULFATE HFA 108 (90 BASE) MCG/ACT IN AERS: 1.0000 | INHALATION_SPRAY | RESPIRATORY_TRACT | 3 refills | Status: AC | PRN

## 2018-03-22 MED ORDER — SENNOSIDES-DOCUSATE SODIUM 8.6-50 MG PO TABS: 1.0000 | ORAL_TABLET | Freq: Two times a day (BID) | ORAL | 3 refills | Status: DC

## 2018-03-22 MED ORDER — METOPROLOL TARTRATE 25 MG PO TABS: 25.0000 mg | ORAL_TABLET | Freq: Two times a day (BID) | ORAL | 3 refills | Status: DC

## 2018-03-22 MED ORDER — GLUCOSE BLOOD VI STRP: ORAL_STRIP | 12 refills | Status: AC

## 2018-03-22 MED ORDER — LIDOCAINE 5 % EX OINT: TOPICAL_OINTMENT | CUTANEOUS | 0 refills | Status: DC

## 2018-03-22 MED ORDER — DOCUSATE SODIUM 100 MG PO CAPS: 100.0000 mg | ORAL_CAPSULE | Freq: Every day | ORAL | 3 refills | Status: DC

## 2018-03-22 NOTE — Telephone Encounter (Signed)
Please put medications in for refill and send to me. Gwen Her. Mannie Stabile, MD

## 2018-03-22 NOTE — Telephone Encounter (Signed)
Refills are in and sent to you in a telephone call.

## 2018-03-31 ENCOUNTER — Encounter: Payer: Self-pay | Admitting: Family Medicine

## 2018-03-31 ENCOUNTER — Other Ambulatory Visit: Payer: Self-pay

## 2018-03-31 ENCOUNTER — Ambulatory Visit (INDEPENDENT_AMBULATORY_CARE_PROVIDER_SITE_OTHER): Payer: Medicare Other | Admitting: Family Medicine

## 2018-03-31 VITALS — BP 126/80 | HR 100 | Temp 98.2°F | Resp 18 | Ht 62.0 in | Wt 226.1 lb

## 2018-03-31 DIAGNOSIS — K5900 Constipation, unspecified: Secondary | ICD-10-CM | POA: Diagnosis not present

## 2018-03-31 DIAGNOSIS — Z9981 Dependence on supplemental oxygen: Secondary | ICD-10-CM | POA: Diagnosis not present

## 2018-03-31 DIAGNOSIS — N183 Chronic kidney disease, stage 3 unspecified: Secondary | ICD-10-CM

## 2018-03-31 DIAGNOSIS — I509 Heart failure, unspecified: Secondary | ICD-10-CM

## 2018-03-31 DIAGNOSIS — E876 Hypokalemia: Secondary | ICD-10-CM

## 2018-03-31 MED ORDER — LINACLOTIDE 145 MCG PO CAPS: 145.0000 ug | ORAL_CAPSULE | Freq: Every day | ORAL | 1 refills | Status: DC

## 2018-03-31 MED ORDER — POTASSIUM CHLORIDE ER 10 MEQ PO CPCR: 20.0000 meq | ORAL_CAPSULE | Freq: Every day | ORAL | 0 refills | Status: DC

## 2018-03-31 NOTE — Progress Notes (Signed)
Patient ID: Rachel Suarez, female    DOB: 1941/05/21, 77 y.o.   MRN: [id number]  Chief Complaint  Patient presents with  . paperwork    for oxygen use    Allergies Patient has no known allergies.  Subjective:   Rachel Suarez is a 77 y.o. female who presents to [facility] today.  HPI Here for follow up and to get paperwork completed.  She reports that the supplier of her home oxygen therapy needs a letter so that her insurance will pay for her home oxygen equipment.  She reports that she had to come in to see her doctor so that I could state in my office visit note that she needs to be on the oxygen.  She reports this test to be done before September 17, 2018.  The home oxygen was initially ordered by cardiology prior to her discharge from the hospital after her diagnosis of congestive heart failure  Has been on home oxygen for 6 years due to CHF. If doesnot have oxygen reports that she is unable to breathe or walk. Still gets winded with oxygen use but it is helpful. Has been on it since dx of [facility] and valve dysfunction.  Is followed by Dr.Kahl and cardiology at [facility], [facility].  She reports that she also needs to get a refill on her Linzess.  She reports she is struggle with constipation for many years and this has been a miracle medication for her.  She reports that she has a bowel movement every morning without any difficulty.  She reports she does not feel abdominal pain or bloating.  She reports she is not having any diarrhea.  She denies any nausea, vomiting, abdominal pain or problems.  Would like to get a 90-day supply of the Linzess.  She also would like to get an increase on her potassium medication.  She reports that she does not feel that these pills are working because they are generic.  She reports she occasionally gets some cramps in her legs and believes that these pills do not work.  She would like to get a new prescription for  potassium pills that is a totally different formulation.  She takes K-Dur 20 milliequivalents a day.  She has been on this dose for many years.  She does not have any current muscle cramps or leg pains.  She reports she does get some muscle cramps at night.  She does not get pain in her muscles her legs when she walks.  She reports that she is urinating well.  Energy is unchanged.  Mood is good.  Appetite is good.  She reports her blood sugars are running pretty well.  She is taking her diabetic medication and using her insulin.  She denies any hypoglycemic episodes.     Past Medical History:  Diagnosis Date  . Allergy   . Anemia   . Arthritis   . Asthma   . Cataract   . CHF (congestive heart failure) ([place])   . CKD (chronic kidney disease) stage 3, GFR 30-59 ml/min (HCC) 12/11/2016  . COPD (chronic obstructive pulmonary disease) ([place])   . Diabetes ([place])    x 30 yrs  . Emphysema of lung ([place])   . Essential hypertension 09/21/2011  . GERD (gastroesophageal reflux disease)   . Heart disease   . High cholesterol   . Oxygen deficiency     Past Surgical History:  Procedure Laterality Date  .  AORTIC VALVE REPLACEMENT    . APPENDECTOMY    . CHOLECYSTECTOMY    . COLONOSCOPY N/A 03/29/2015   Procedure: COLONOSCOPY;  Surgeon: Rogene Houston, MD;  [facility];  Service: Endoscopy;  Laterality: N/A;  1015 - moved to 1:10 - Ann to notify  . COLONOSCOPY N/A 06/24/2016   Procedure: COLONOSCOPY;  Surgeon: Rogene Houston, MD;  Location: [facility];  Service: Endoscopy;  Laterality: N/A;  245 - moved to 10/18 @ 1:00 - Ann to notify pt  . CORONARY ARTERY BYPASS GRAFT  1993  . ESOPHAGEAL DILATION N/A 03/29/2015   Procedure: ESOPHAGEAL DILATION;  Surgeon: Rogene Houston, MD;  [facility];  Service: Endoscopy;  Laterality: N/A;  . ESOPHAGOGASTRODUODENOSCOPY N/A 03/29/2015   Procedure: ESOPHAGOGASTRODUODENOSCOPY (EGD);  Surgeon: Rogene Houston, MD;  [facility];   Service: Endoscopy;  Laterality: N/A;  . GIVENS CAPSULE STUDY N/A 12/25/2015   Procedure: GIVENS CAPSULE STUDY;  Surgeon: Rogene Houston, MD;  [facility];  Service: Endoscopy;  Laterality: N/A;    Family History  Problem Relation Age of Onset  . Heart disease Mother   . Arthritis Mother   . Asthma Mother   . Diabetes Mother   . Heart disease Father   . Arthritis Father   . Diabetes Father   . Heart disease Brother   . Pneumonia Brother      Social History   Socioeconomic History  . Marital status: Single    Spouse name: Not on file  . Number of children: 0  . Years of education: 10  . Highest education level: Not on file  Occupational History  . Occupation: retired  Scientific laboratory technician  . Financial resource strain: Not hard at all  . Food insecurity:    Worry: Never true    Inability: Never true  . Transportation needs:    Medical: No    Non-medical: No  Tobacco Use  . Smoking status: Never Smoker  . Smokeless tobacco: Never Used  Substance and Sexual Activity  . Alcohol use: No    Alcohol/week: 0.0 oz  . Drug use: No  . Sexual activity: Never  Lifestyle  . Physical activity:    Days per week: 0 days    Minutes per session: 0 min  . Stress: Not at all  Relationships  . Social connections:    Talks on phone: More than three times a week    Gets together: More than three times a week    Attends religious service: More than 4 times per year    Active member of club or organization: No    Attends meetings of clubs or organizations: Never    Relationship status: Never married  Other Topics Concern  . Not on file  Social History Narrative   Lives in own home   Often nieces or nephews come to stay   No pets   Current Outpatient Medications on File Prior to Visit  Medication Sig Dispense Refill  . albuterol (PROVENTIL HFA;VENTOLIN HFA) 108 (90 Base) MCG/ACT inhaler Inhale 1-2 puffs into the lungs every 4 (four) hours as needed for wheezing or shortness  of breath. 1 Inhaler 3  . albuterol (PROVENTIL) (2.5 MG/3ML) 0.083% nebulizer solution INHALE CONTENTS OF 1 VIAL BY NEBULIZER EVERY 4 HOURS AS NEEDED 180 mL 3  . allopurinol (ZYLOPRIM) 300 MG tablet Take 1 tablet (300 mg total) by mouth daily. 90 tablet 0  . aspirin EC 81  MG tablet Take 1 tablet (81 mg total) by mouth daily.    . budesonide-formoterol (SYMBICORT) 80-4.5 MCG/ACT inhaler Inhale 2 puffs into the lungs 2 (two) times daily. 1 Inhaler 3  . CARTIA XT 180 MG 24 hr capsule Take 1 capsule (180 mg total) by mouth daily. 90 capsule 0  . Cholecalciferol (VITAMIN D3) 5000 units TABS Take 5,000 Units by mouth daily.    . Diclofenac Sodium 3 % GEL Apply 1 application topically 4 (four) times daily as needed. For pain in legs/back. 100 g 2  . docusate sodium (COLACE) 100 MG capsule Take 1 capsule (100 mg total) by mouth daily. 90 capsule 3  . exenatide (BYETTA) 10 MCG/0.04ML SOPN injection Inject 0.04 mLs (10 mcg total) into the skin 2 (two) times daily with a meal. 5 pen 3  . FERREX 150 150 MG capsule Take 1 capsule (150 mg total) by mouth daily. 90 capsule 0  . furosemide (LASIX) 40 MG tablet Take 1 tablet (40 mg total) by mouth 2 (two) times daily. 30 tablet 1  . glucose blood (IGLUCOSE TEST STRIPS) test strip Test twice daily Disp. Brand covered by insurance. Dx e11.9 100 each 12  . Insulin Glargine (LANTUS SOLOSTAR) 100 UNIT/ML Solostar Pen Inject 70 Units into the skin daily at 10 pm. 15 mL 3  . isosorbide mononitrate (IMDUR) 60 MG 24 hr tablet Take 1 tablet (60 mg total) by mouth daily. 60 tablet 3  . lidocaine (XYLOCAINE) 5 % ointment Apply 1-2 pumps up to four times daily-rub in well 141.76 g 0  . metFORMIN (GLUCOPHAGE) 500 MG tablet Take 2 tablets in AM and 1 tablet in PM for diabetes 90 tablet 3  . metolazone (ZAROXOLYN) 2.5 MG tablet Take 1 tablet (2.5 mg total) by mouth every other day. 45 tablet 3  . metoprolol tartrate (LOPRESSOR) 25 MG tablet Take 1 tablet (25 mg total) by mouth 2  (two) times daily. 60 tablet 3  . Multiple Vitamins-Minerals (ALIVE WOMENS ENERGY) TABS Take 1 tablet by mouth daily.    . ondansetron (ZOFRAN) 4 MG tablet TAKE ONE TABLET BY MOUTH THREE TIMES DAILY AS NEEDED 30 tablet 0  . rosuvastatin (CRESTOR) 20 MG tablet Take 1 tablet (20 mg total) by mouth daily. 90 tablet 1  . UNABLE TO FIND C-BACL-GABA-IBU-PRIL - 2%-2%-5%-2%  Cream.  Apply 1-2 GM ( 1-2 pumps) up to 4 times daily, and rub in well. 100 g 2  . UNABLE TO FIND accuchek test strips test 4 times daily, lancets test 4 times daily. Dx e11.9 200 each 3   No current facility-administered medications on file prior to visit.     Review of Systems  Constitutional: Negative for activity change, appetite change, fever and unexpected weight change.  Eyes: Negative for visual disturbance.  Respiratory: Positive for shortness of breath. Negative for cough and chest tightness.        Patient still reports she gets shortness of breath with extended exertion but this is unchanged.  She reports she is at her baseline.  She denies any cough.  Cardiovascular: Negative for chest pain, palpitations and leg swelling.  Gastrointestinal: Negative for abdominal distention, abdominal pain, anal bleeding, blood in stool, constipation, diarrhea, nausea, rectal pain and vomiting.  Genitourinary: Negative for dysuria, frequency and urgency.  Neurological: Negative for dizziness, syncope and light-headedness.  Hematological: Negative for adenopathy.  Psychiatric/Behavioral: Negative for agitation, confusion and dysphoric mood. The patient is not nervous/anxious.      Objective:  BP 126/80 (BP Location: Left Arm, Patient Position: Sitting, Cuff Size: Large)   Pulse 100   Temp 98.2 F (36.8 C) (Temporal)   Resp 18   Ht 5\' 2"  (1.575 m)   Wt 226 lb 1.3 oz (102.5 kg)   SpO2 97% Comment: with 2LPM  BMI 41.35 kg/m   Physical Exam  Constitutional: She is oriented to person, place, and time. She appears  well-developed and well-nourished.  Well-developed, well-nourished, elderly female, obese sitting in exam chair with O2 nasal cannula 2 L in place.  Able to speak in complete sentences.  Pleasant and very friendly.  HENT:  Head: Normocephalic and atraumatic.  Eyes: Pupils are equal, round, and reactive to light. EOM are normal.  Neck: Normal range of motion. Neck supple.  Cardiovascular: Normal rate and regular rhythm.  Pulmonary/Chest: Effort normal and breath sounds normal. No respiratory distress. She has no wheezes.  Abdominal: Soft. Bowel sounds are normal. She exhibits no distension. There is no tenderness.  Neurological: She is alert and oriented to person, place, and time.  Skin: Skin is warm and dry.  Psychiatric: She has a normal mood and affect. Her behavior is normal. Judgment and thought content normal.     Assessment and Plan  1. Hypokalemia Discussed with patient that we will need to check her potassium to see if it is low prior to increasing her dose.  I did change her formulation to Micro-K 10 mEq tablets.  She will take 2 tablets a day.  She will check her BMP today. - Basic metabolic panel - potassium chloride (MICRO-K) 10 MEQ CR capsule; Take 2 capsules (20 mEq total) by mouth daily.  Dispense: 180 capsule; Refill: 0  2. Constipation, unspecified constipation type Patient with vast improvement in her constipation since starting Linzess.  We will continue this medication.  Risk versus benefits discussed with patient.  She was counseled concerning worrisome signs and symptoms of diverticulitis or bowel problems and if those ever occur again she should contact medical help.  She voiced understanding. - linaclotide (LINZESS) 145 MCG CAPS capsule; Take 1 capsule (145 mcg total) by mouth daily before breakfast.  Dispense: 90 capsule; Refill: 1  3. Supplemental oxygen dependent Oxygen dependent patient is secondary to congestive heart failure.  She is followed by cardiology.   She has a continued need for home oxygen.  I did not have any paperwork regarding required certificate for medical necessity.  I do believe this is a medical necessity secondary to her cardiopulmonary disease.  She will continue her home oxygen use and we will complete any paperwork sent to [place] by insurance company. Patient will follow-up with her new primary care physician in 1 to 2 months or sooner if needed.  She will call our office with any questions or concerns. No follow-ups on file. Caren Macadam, MD 03/31/2018

## 2018-04-11 ENCOUNTER — Telehealth: Payer: Self-pay | Admitting: Family Medicine

## 2018-04-11 ENCOUNTER — Other Ambulatory Visit: Payer: Self-pay | Admitting: Family Medicine

## 2018-04-11 NOTE — Telephone Encounter (Signed)
Pt is calling stating that her Lasix is not working,,, she needs something else.Marland Kitchen Also checking on her Diabetic shoes.

## 2018-04-13 NOTE — Telephone Encounter (Signed)
Spoke with patient and advised that she needs to call her cardiologist regarding Lasix with verbal understanding. Patient has an appt already scheduled for diabetic foot exam on 05/03/18. Advised her to keep upcoming appt with verbal understanding.

## 2018-04-13 NOTE — Telephone Encounter (Signed)
Spoke with patient regarding her Lasix not working. She states that she increased it to 2 tablets BID. She was taking it 2 tablets am and 1 tablet pm. I advised the patient I would send Dr.Hagler a message letting her know that she had increased the Lasix herself and see what her recommendation was. She verbalized understanding.  She is also requesting a prescription for diabetic shoes.

## 2018-04-13 NOTE — Telephone Encounter (Signed)
Please call patient and advised that she needs to contact her cardiologist regarding her Lasix.  Her Lasix has been prescribed and monitored by Dr. call at [facility].  Please advise her that I would need to do a diabetic foot exam before I can write her prescription for this use because it is required by insurance.  She can follow-up and we can do this at that time.

## 2018-04-15 ENCOUNTER — Telehealth: Payer: Self-pay | Admitting: Family Medicine

## 2018-04-15 NOTE — Telephone Encounter (Signed)
Patient left voicemail requesting a call back  To [phone], I have called 3x with busy tone.

## 2018-04-18 ENCOUNTER — Telehealth: Payer: Self-pay | Admitting: Family Medicine

## 2018-04-18 NOTE — Telephone Encounter (Signed)
Requesting for her bowel movement meds & her potassium  To be sent to [facility], not  [facility].  336/ [phone]

## 2018-04-19 ENCOUNTER — Other Ambulatory Visit: Payer: Self-pay

## 2018-04-19 DIAGNOSIS — K5900 Constipation, unspecified: Secondary | ICD-10-CM

## 2018-04-19 DIAGNOSIS — E876 Hypokalemia: Secondary | ICD-10-CM

## 2018-04-19 MED ORDER — POTASSIUM CHLORIDE ER 10 MEQ PO CPCR: 20.0000 meq | ORAL_CAPSULE | Freq: Every day | ORAL | 0 refills | Status: DC

## 2018-04-19 MED ORDER — LINACLOTIDE 145 MCG PO CAPS: 145.0000 ug | ORAL_CAPSULE | Freq: Every day | ORAL | 1 refills | Status: DC

## 2018-04-19 MED ORDER — DOCUSATE SODIUM 100 MG PO CAPS: 100.0000 mg | ORAL_CAPSULE | Freq: Every day | ORAL | 3 refills | Status: DC

## 2018-04-19 NOTE — Telephone Encounter (Signed)
Medications refilled and sent to [facility]

## 2018-04-21 ENCOUNTER — Other Ambulatory Visit: Payer: Self-pay | Admitting: Family Medicine

## 2018-04-22 ENCOUNTER — Telehealth: Payer: Self-pay | Admitting: Family Medicine

## 2018-04-22 ENCOUNTER — Other Ambulatory Visit: Payer: Self-pay

## 2018-04-22 MED ORDER — ONDANSETRON HCL 4 MG PO TABS: 4.0000 mg | ORAL_TABLET | Freq: Three times a day (TID) | ORAL | 0 refills | Status: DC | PRN

## 2018-04-22 NOTE — Telephone Encounter (Signed)
Patient called to request her zofran refill. States pharmacy does not have it.

## 2018-04-22 NOTE — Telephone Encounter (Signed)
Refill sent.

## 2018-05-03 ENCOUNTER — Ambulatory Visit (INDEPENDENT_AMBULATORY_CARE_PROVIDER_SITE_OTHER): Payer: Medicare Other | Admitting: Internal Medicine

## 2018-05-03 ENCOUNTER — Encounter: Payer: Self-pay | Admitting: Family Medicine

## 2018-05-03 ENCOUNTER — Encounter (INDEPENDENT_AMBULATORY_CARE_PROVIDER_SITE_OTHER): Payer: Self-pay | Admitting: Internal Medicine

## 2018-05-03 ENCOUNTER — Ambulatory Visit (INDEPENDENT_AMBULATORY_CARE_PROVIDER_SITE_OTHER): Payer: Medicare Other | Admitting: Family Medicine

## 2018-05-03 ENCOUNTER — Other Ambulatory Visit: Payer: Self-pay

## 2018-05-03 VITALS — BP 138/80 | HR 78 | Temp 98.2°F | Resp 18 | Ht 62.0 in | Wt 233.3 lb

## 2018-05-03 VITALS — BP 116/82 | HR 80 | Temp 98.0°F | Resp 12 | Ht <= 58 in | Wt 235.0 lb

## 2018-05-03 DIAGNOSIS — R1310 Dysphagia, unspecified: Secondary | ICD-10-CM | POA: Diagnosis not present

## 2018-05-03 DIAGNOSIS — E1143 Type 2 diabetes mellitus with diabetic autonomic (poly)neuropathy: Secondary | ICD-10-CM

## 2018-05-03 DIAGNOSIS — Z862 Personal history of diseases of the blood and blood-forming organs and certain disorders involving the immune mechanism: Secondary | ICD-10-CM | POA: Diagnosis not present

## 2018-05-03 DIAGNOSIS — E1142 Type 2 diabetes mellitus with diabetic polyneuropathy: Secondary | ICD-10-CM | POA: Diagnosis not present

## 2018-05-03 DIAGNOSIS — R1319 Other dysphagia: Secondary | ICD-10-CM

## 2018-05-03 DIAGNOSIS — K3184 Gastroparesis: Secondary | ICD-10-CM | POA: Diagnosis not present

## 2018-05-03 MED ORDER — AMITRIPTYLINE HCL 25 MG PO TABS: 25.0000 mg | ORAL_TABLET | Freq: Every day | ORAL | 0 refills | Status: DC

## 2018-05-03 MED ORDER — UNABLE TO FIND: 0 refills | Status: AC

## 2018-05-03 NOTE — Progress Notes (Signed)
Patient ID: [name], female    DOB: 1941/04/05, 77 y.o.   MRN: [id number]  Chief Complaint  Patient presents with  . Diabetic Foot Exam    Allergies Patient has no known allergies.  Subjective:   Rachel Suarez is a 77 y.o. female who presents to [facility] today.  HPI Here for form to be completed for diabetic shoes.  She does not have a form with her today but reports that she would like to get the shoes from [employer] rather than from [employer]. 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  She does wear diabetic keep the hammertoes and causing ulcerations.  She reports that toes and feet ache.  She does get numbness in feet from time to time. Does not have any diabetic shoes that are currently shape for her to wear. Needs shoes for feet and to prevent ulcerations.  No current lesions on her feet.  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  She has been tried on gabapentin in the past and it did not help her symptoms.  She requests a new medication for trial today.  She has already been seen by her new PCP, Dr. Lorra Suarez, but comes in today for visit for shoes since she has been seen here in the past.  Reports that she will follow-up with her new PCP regarding this medication.  Has never been tried on Elavil.  Has no heart conduction issues.  Last EKG with Dr. call was within normal limits.  Is on oxygen and has known diagnosis of congestive heart failure.  She has had no recent change in her shortness of breath.  She is not having any swelling.  She did see Dr. Laural Suarez today for her swallowing and bowels.    Past Medical History:  Diagnosis Date  . Allergy   . Anemia   . Arthritis   . Asthma   . Cataract   . CHF (congestive heart failure) ([place])   . CKD (chronic kidney disease) stage 3, GFR 30-59 ml/min (HCC) 12/11/2016  . COPD (chronic obstructive pulmonary disease) ([place])   . Diabetes  ([place])    x 30 yrs  . Emphysema of lung ([place])   . Essential hypertension 09/21/2011  . GERD (gastroesophageal reflux disease)   . Heart disease   . High cholesterol   . Oxygen deficiency     Past Surgical History:  Procedure Laterality Date  . AORTIC VALVE REPLACEMENT    . APPENDECTOMY    . CHOLECYSTECTOMY    . COLONOSCOPY N/A 03/29/2015   Procedure: COLONOSCOPY;  Surgeon: Rachel Houston, MD;  [facility];  Service: Endoscopy;  Laterality: N/A;  1015 - moved to 1:10 - Ann to notify  . COLONOSCOPY N/A 06/24/2016   Procedure: COLONOSCOPY;  Surgeon: Rachel Houston, MD;  Location: [facility];  Service: Endoscopy;  Laterality: N/A;  245 - moved to 10/18 @ 1:00 - Ann to notify pt  . CORONARY ARTERY BYPASS GRAFT  1993  . ESOPHAGEAL DILATION N/A 03/29/2015   Procedure: ESOPHAGEAL DILATION;  Surgeon: Rachel Houston, MD;  [facility]: [facility];  Service: Endoscopy;  Laterality: N/A;  . ESOPHAGOGASTRODUODENOSCOPY N/A 03/29/2015   Procedure: ESOPHAGOGASTRODUODENOSCOPY (EGD);  Surgeon: Rachel Houston, MD;  [facility];  Service: Endoscopy;  Laterality: N/A;  . GIVENS CAPSULE STUDY N/A 12/25/2015  Procedure: GIVENS CAPSULE STUDY;  Surgeon: Rachel Houston, MD;  [facility];  Service: Endoscopy;  Laterality: N/A;    Family History  Problem Relation Age of Onset  . Heart disease Mother   . Arthritis Mother   . Asthma Mother   . Diabetes Mother   . Heart disease Father   . Arthritis Father   . Diabetes Father   . Heart disease Brother   . Pneumonia Brother      Social History   Socioeconomic History  . Marital status: Single    Spouse name: Not on file  . Number of children: 0  . Years of education: 10  . Highest education level: Not on file  Occupational History  . Occupation: retired  Scientific laboratory technician  . Financial resource strain: Not hard at all  . Food insecurity:    Worry: Never true    Inability: Never true  . Transportation needs:     Medical: No    Non-medical: No  Tobacco Use  . Smoking status: Never Smoker  . Smokeless tobacco: Never Used  Substance and Sexual Activity  . Alcohol use: No    Alcohol/week: 0.0 standard drinks  . Drug use: No  . Sexual activity: Never  Lifestyle  . Physical activity:    Days per week: 0 days    Minutes per session: 0 min  . Stress: Not at all  Relationships  . Social connections:    Talks on phone: More than three times a week    Gets together: More than three times a week    Attends religious service: More than 4 times per year    Active member of club or organization: No    Attends meetings of clubs or organizations: Never    Relationship status: Never married  Other Topics Concern  . Not on file  Social History Narrative   Lives in own home   Often nieces or nephews come to stay   No pets   Current Outpatient Medications on File Prior to Visit  Medication Sig Dispense Refill  . albuterol (PROVENTIL HFA;VENTOLIN HFA) 108 (90 Base) MCG/ACT inhaler Inhale 1-2 puffs into the lungs every 4 (four) hours as needed for wheezing or shortness of breath. 1 Inhaler 3  . albuterol (PROVENTIL) (2.5 MG/3ML) 0.083% nebulizer solution INHALE CONTENTS OF 1 VIAL BY NEBULIZER EVERY 4 HOURS AS NEEDED 180 mL 3  . allopurinol (ZYLOPRIM) 300 MG tablet Take 1 tablet (300 mg total) by mouth daily. 90 tablet 0  . aspirin EC 81 MG tablet Take 1 tablet (81 mg total) by mouth daily.    . budesonide-formoterol (SYMBICORT) 80-4.5 MCG/ACT inhaler Inhale 2 puffs into the lungs 2 (two) times daily. 1 Inhaler 3  . CARTIA XT 180 MG 24 hr capsule Take 1 capsule (180 mg total) by mouth daily. 90 capsule 0  . Cholecalciferol (VITAMIN D3) 5000 units TABS Take 5,000 Units by mouth daily.    . Diclofenac Sodium 3 % GEL Apply 1 application topically 4 (four) times daily as needed. For pain in legs/back. 100 g 2  . docusate sodium (COLACE) 100 MG capsule Take 1 capsule (100 mg total) by mouth daily. 90 capsule 3    . exenatide (BYETTA) 10 MCG/0.04ML SOPN injection Inject 0.04 mLs (10 mcg total) into the skin 2 (two) times daily with a meal. 5 pen 3  . FERREX 150 150 MG capsule Take 1 capsule (150 mg total) by mouth daily. 90 capsule 0  .  furosemide (LASIX) 40 MG tablet TAKE ONE TABLET BY MOUTH TWICE DAILY. 180 tablet 1  . glucose blood (IGLUCOSE TEST STRIPS) test strip Test twice daily Disp. Brand covered by insurance. Dx e11.9 100 each 12  . Insulin Glargine (LANTUS SOLOSTAR) 100 UNIT/ML Solostar Pen Inject 70 Units into the skin daily at 10 pm. 15 mL 3  . isosorbide mononitrate (IMDUR) 60 MG 24 hr tablet Take 1 tablet (60 mg total) by mouth daily. 60 tablet 3  . lidocaine (XYLOCAINE) 5 % ointment Apply 1-2 pumps up to four times daily-rub in well 141.76 g 0  . linaclotide (LINZESS) 145 MCG CAPS capsule Take 1 capsule (145 mcg total) by mouth daily before breakfast. 90 capsule 1  . metFORMIN (GLUCOPHAGE) 500 MG tablet Take 2 tablets in AM and 1 tablet in PM for diabetes 90 tablet 3  . metolazone (ZAROXOLYN) 2.5 MG tablet Take 1 tablet (2.5 mg total) by mouth every other day. 45 tablet 3  . metoprolol tartrate (LOPRESSOR) 25 MG tablet Take 1 tablet (25 mg total) by mouth 2 (two) times daily. 60 tablet 3  . Multiple Vitamins-Minerals (ALIVE WOMENS ENERGY) TABS Take 1 tablet by mouth daily.    . nitroGLYCERIN (NITROSTAT) 0.4 MG SL tablet Place 0.4 mg under the tongue as directed.    . ondansetron (ZOFRAN) 4 MG tablet TAKE ONE TABLET BY MOUTH THREE TIMES DAILY AS NEEDED 30 tablet 0  . potassium chloride (MICRO-K) 10 MEQ CR capsule Take 2 capsules (20 mEq total) by mouth daily. 180 capsule 0  . rosuvastatin (CRESTOR) 20 MG tablet Take 1 tablet (20 mg total) by mouth daily. 90 tablet 1  . UNABLE TO FIND C-BACL-GABA-IBU-PRIL - 2%-2%-5%-2%  Cream.  Apply 1-2 GM ( 1-2 pumps) up to 4 times daily, and rub in well. 100 g 2  . UNABLE TO FIND accuchek test strips test 4 times daily, lancets test 4 times daily. Dx e11.9  200 each 3   No current facility-administered medications on file prior to visit.     Review of Systems  Constitutional: Negative for activity change, appetite change and fever.  Eyes: Negative for visual disturbance.  Respiratory: Positive for shortness of breath. Negative for cough and chest tightness.        Breathing issues are stable.  Currently wearing her O2 nasal cannula around-the-clock.  Cardiovascular: Negative for chest pain, palpitations and leg swelling.  Gastrointestinal: Negative for abdominal pain, nausea and vomiting.  Genitourinary: Negative for dysuria, frequency and urgency.  Neurological: Negative for dizziness, syncope and light-headedness.       Burning in her feet. None current.   Hematological: Negative for adenopathy. Does not bruise/bleed easily.     Objective:   BP 116/82 (BP Location: Left Arm, Patient Position: Sitting, Cuff Size: Large)   Pulse 80   Temp 98 F (36.7 C) (Temporal)   Resp 12   Ht 4\' 9"  (1.448 m)   Wt 235 lb (106.6 kg)   SpO2 95% Comment: with 2LPM oxygen  BMI 50.85 kg/m   Physical Exam  Constitutional: She is oriented to person, place, and time. She appears well-developed and well-nourished.  Obese elderly female, O2 nasal cannula in place.  No acute distress.  Able to speak in complete sentences.  HENT:  Head: Normocephalic and atraumatic.  Mouth/Throat: Oropharynx is clear and moist.  Eyes: Pupils are equal, round, and reactive to light. Conjunctivae are normal.  Neck: Normal range of motion. Neck supple. No JVD present.  Cardiovascular: Normal  rate and regular rhythm.  Pulmonary/Chest: Effort normal and breath sounds normal. No respiratory distress. She has no wheezes. She exhibits no tenderness.  Neurological: She is alert and oriented to person, place, and time. No cranial nerve deficit.  Skin: Skin is warm and dry. No rash noted.   Diabetic Foot Exam - Simple   Simple Foot Form Diabetic Foot exam was performed with  the following findings:  Yes 05/03/2018  2:13 PM  Visual Inspection See comments:  Yes Sensation Testing See comments:  Yes Pulse Check See comments:  Yes Comments Patient does have bilateral hammertoe deformities of her second toes.  Feet and legs are warm bilaterally.  No skin breakdown or ulcerations.  Patient has decreased sensation to monofilament testing in multiple sites.  Extremities are warm with no cutaneous temperature difference bilaterally.  No skin lesions.  Trace dorsalis pedis pulses.     Assessment and Plan   1. Diabetic peripheral neuropathy (HCC) Trial of elavil.  Risks versus benefits of medicine discussed.  Patient wishes to try medicine due to the fact that she reports that the symptoms of neuropathy greatly affects her quality of life. Patient counseled in detail regarding the risks of medication. Told to call or return to clinic if develop any worrisome signs or symptoms. Patient voiced understanding.  Foot care discussed. Diabetic foot exam performed. Will complete form for diabetic shoes. Patient will follow-up within the next couple weeks with her new PCP and discuss if she has had any improvement with her peripheral neuropathy.  Patient will seek further evaluation by her new PCP. - amitriptyline (ELAVIL) 25 MG tablet; Take 1 tablet (25 mg total) by mouth at bedtime.  Dispense: 30 tablet; Refill: 0 No follow-ups on file. Rachel Macadam, MD 05/03/2018

## 2018-05-03 NOTE — Progress Notes (Signed)
Presenting complaint:  Follow-up for iron deficiency anemia. Patient complains of dysphagia and early satiety.  Database and subjective:  Patient is 77 year old Caucasian female who has a history of iron deficiency anemia for which she was initially evaluated in 2016 with EGD and colonoscopy.  She also had small bowel given capsule study in 2017.  She has a history of multiple colonic adenomas.  Her last colonoscopy was in October 2017.  She remains on p.o. Iron.  She now presents with intermittent dysphagia to solids.  She says she has 1 or 2 episodes of a week.  When this occurs she just quits eating and waits a while and food she rarely has heartburn.  She also complains of early satiety.  She states she gets filled up after few bites.  Therefore she is eating multiple small meals.  She says she is on a bland diet.  She is eliminated fatty and fried foods.  She says she is been diabetic for 30 years.  She has not experienced nausea or vomiting.  She has chronic constipation.  She states since she has been on Linzess her bowels been moving every day.  She denies melena or rectal bleeding.   Current Medications: Outpatient Encounter Medications as of 05/03/2018  Medication Sig  . albuterol (PROVENTIL HFA;VENTOLIN HFA) 108 (90 Base) MCG/ACT inhaler Inhale 1-2 puffs into the lungs every 4 (four) hours as needed for wheezing or shortness of breath.  Marland Kitchen albuterol (PROVENTIL) (2.5 MG/3ML) 0.083% nebulizer solution INHALE CONTENTS OF 1 VIAL BY NEBULIZER EVERY 4 HOURS AS NEEDED  . allopurinol (ZYLOPRIM) 300 MG tablet Take 1 tablet (300 mg total) by mouth daily.  Marland Kitchen aspirin EC 81 MG tablet Take 1 tablet (81 mg total) by mouth daily.  . budesonide-formoterol (SYMBICORT) 80-4.5 MCG/ACT inhaler Inhale 2 puffs into the lungs 2 (two) times daily.  Marland Kitchen CARTIA XT 180 MG 24 hr capsule Take 1 capsule (180 mg total) by mouth daily.  . Cholecalciferol (VITAMIN D3) 5000 units TABS Take 5,000 Units by mouth daily.  .  Diclofenac Sodium 3 % GEL Apply 1 application topically 4 (four) times daily as needed. For pain in legs/back.  . docusate sodium (COLACE) 100 MG capsule Take 1 capsule (100 mg total) by mouth daily.  Marland Kitchen exenatide (BYETTA) 10 MCG/0.04ML SOPN injection Inject 0.04 mLs (10 mcg total) into the skin 2 (two) times daily with a meal.  . FERREX 150 150 MG capsule Take 1 capsule (150 mg total) by mouth daily.  . furosemide (LASIX) 40 MG tablet TAKE ONE TABLET BY MOUTH TWICE DAILY.  Marland Kitchen glucose blood (IGLUCOSE TEST STRIPS) test strip Test twice daily Disp. Brand covered by insurance. Dx e11.9  . Insulin Glargine (LANTUS SOLOSTAR) 100 UNIT/ML Solostar Pen Inject 70 Units into the skin daily at 10 pm.  . isosorbide mononitrate (IMDUR) 60 MG 24 hr tablet Take 1 tablet (60 mg total) by mouth daily.  Marland Kitchen lidocaine (XYLOCAINE) 5 % ointment Apply 1-2 pumps up to four times daily-rub in well  . linaclotide (LINZESS) 145 MCG CAPS capsule Take 1 capsule (145 mcg total) by mouth daily before breakfast.  . metFORMIN (GLUCOPHAGE) 500 MG tablet Take 2 tablets in AM and 1 tablet in PM for diabetes  . metolazone (ZAROXOLYN) 2.5 MG tablet Take 1 tablet (2.5 mg total) by mouth every other day.  . metoprolol tartrate (LOPRESSOR) 25 MG tablet Take 1 tablet (25 mg total) by mouth 2 (two) times daily.  . Multiple Vitamins-Minerals (ALIVE WOMENS ENERGY)  TABS Take 1 tablet by mouth daily.  . ondansetron (ZOFRAN) 4 MG tablet TAKE ONE TABLET BY MOUTH THREE TIMES DAILY AS NEEDED  . potassium chloride (MICRO-K) 10 MEQ CR capsule Take 2 capsules (20 mEq total) by mouth daily.  . rosuvastatin (CRESTOR) 20 MG tablet Take 1 tablet (20 mg total) by mouth daily.  Marland Kitchen UNABLE TO FIND C-BACL-GABA-IBU-PRIL - 2%-2%-5%-2%  Cream.  Apply 1-2 GM ( 1-2 pumps) up to 4 times daily, and rub in well.  Marland Kitchen UNABLE TO FIND accuchek test strips test 4 times daily, lancets test 4 times daily. Dx e11.9  . [DISCONTINUED] albuterol (PROVENTIL HFA;VENTOLIN HFA) 108  (90 Base) MCG/ACT inhaler Inhale into the lungs.  . [DISCONTINUED] albuterol (PROVENTIL) (2.5 MG/3ML) 0.083% nebulizer solution INHALE CONTENTS OF 1 VIAL BY NEBULIZER EVERY 4 HOURS AS NEEDED  . [DISCONTINUED] allopurinol (ZYLOPRIM) 300 MG tablet TAKE ONE TABLET BY MOUTH DAILY  . [DISCONTINUED] budesonide-formoterol (SYMBICORT) 80-4.5 MCG/ACT inhaler Inhale 2 puffs into the lungs 2 (two) times daily.  . [DISCONTINUED] CARTIA XT 180 MG 24 hr capsule Take 180 mg by mouth daily.  . [DISCONTINUED] Diclofenac Sodium 3 % GEL Apply 1 application 4 (four) times daily as needed topically. For pain in legs/back.  . [DISCONTINUED] digoxin (LANOXIN) 0.125 MG tablet Take 0.125 mg by mouth every evening.   . [DISCONTINUED] docusate sodium (COLACE) 100 MG capsule Take 1 capsule (100 mg total) by mouth 2 (two) times daily as needed for mild constipation.  . [DISCONTINUED] exenatide (BYETTA) 10 MCG/0.04ML SOPN injection Inject 10 mcg into the skin 2 (two) times daily with a meal.  . [DISCONTINUED] FERREX 150 150 MG capsule Take 150 mg by mouth daily.  . [DISCONTINUED] furosemide (LASIX) 40 MG tablet Take 40 mg by mouth 2 (two) times daily.  . [DISCONTINUED] glucose blood (IGLUCOSE TEST STRIPS) test strip Test twice daily Disp. Brand covered by insurance. Dx e11.9  . [DISCONTINUED] Insulin Glargine (LANTUS SOLOSTAR) 100 UNIT/ML Solostar Pen Inject 70 Units into the skin daily at 10 pm.   . [DISCONTINUED] isosorbide mononitrate (IMDUR) 60 MG 24 hr tablet Take 60 mg by mouth daily.  . [DISCONTINUED] lidocaine (XYLOCAINE) 5 % ointment apply 1-2 grams (TWO pumps) 3 TO 4 times daily AS NEEDED RUB in WELL  . [DISCONTINUED] lubiprostone (AMITIZA) 24 MCG capsule Take as directed for constipation  . [DISCONTINUED] metFORMIN (GLUCOPHAGE) 500 MG tablet Take 2 tablets in AM and 1 tablet in PM for diabetes  . [DISCONTINUED] metolazone (ZAROXOLYN) 2.5 MG tablet Take 1 tablet (2.5 mg total) by mouth every other day.  .  [DISCONTINUED] metoprolol tartrate (LOPRESSOR) 25 MG tablet Take by mouth.  . [DISCONTINUED] ondansetron (ZOFRAN) 4 MG tablet TAKE ONE TABLET BY MOUTH THREE TIMES DAILY AS NEEDED.  . [DISCONTINUED] potassium chloride SA (K-DUR,KLOR-CON) 20 MEQ tablet Take 1 tablet (20 mEq total) by mouth daily.   No facility-administered encounter medications on file as of 05/03/2018.      Objective: Blood pressure 138/80, pulse 78, temperature 98.2 F (36.8 C), temperature source Oral, resp. rate 18, height 5\' 2"  (1.575 m), weight 233 lb 4.8 oz (105.8 kg). Patient is alert and in no acute distress. She is on O2 via nasal cannula. Conjunctiva is pink. Sclera is nonicteric Oropharyngeal mucosa is normal. No neck masses or thyromegaly noted. Cardiac exam with regular rhythm normal S1 and S2. No murmur or gallop noted. Lungs are clear to auscultation. Abdomen is full but soft and nontender with organomegaly or masses. She has 1+  edema around her ankles.  Labs/studies Results: Lab data from 01/20/2018 WBC 13.6, H&H 11.9 and 36.1 MCV 80.8 and platelet count 300K.  Serum iron 40, TIBC 404 and saturation 10%. Serum ferritin 12.  Assessment:  #1.  Esophageal dysphagia.  She possibly has esophageal motility disorder.  Her course would be monitored over the next few months before further evaluation undertaken.  #2.  Early satiety.  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  She has been diabetic for 30 years.  It appears she is on gastroparesis diet.  If she she develops nausea vomiting will consider EGD prior to solid-phase gastric emptying study.  #3.  History of iron deficiency anemia.  She has undergone multiple studies in the past.  Iron stores remain low but H&H is normal.  She may also have impaired iron absorption.  #4. she has a history of multiple colonic adenomas.  Last colonoscopy was in October 2017.  May consider one in October 2020.   Plan:  Patient will call office if dysphagia episodes  occur more frequently or if she has an episode of food impaction. She was given written information regarding gastroparesis diet. Patient advised to call office if she has melena or rectal bleeding. She will continue p.o. Iron. She will return for OV in six months.

## 2018-05-03 NOTE — Patient Instructions (Addendum)
Notify if swallowing difficulty comes more frequent. Gastroparesis diet. Notify if you have rectal bleeding or tarry stools.

## 2018-05-19 ENCOUNTER — Other Ambulatory Visit: Payer: Self-pay

## 2018-05-19 ENCOUNTER — Telehealth: Payer: Self-pay | Admitting: Family Medicine

## 2018-05-19 ENCOUNTER — Other Ambulatory Visit: Payer: Self-pay | Admitting: Family Medicine

## 2018-05-19 DIAGNOSIS — E876 Hypokalemia: Secondary | ICD-10-CM

## 2018-05-19 MED ORDER — LIDOCAINE 5 % EX OINT: TOPICAL_OINTMENT | CUTANEOUS | 0 refills | Status: AC

## 2018-05-19 NOTE — Telephone Encounter (Signed)
Pt is calling requesting Lidocaine--call in to [facility]

## 2018-05-19 NOTE — Telephone Encounter (Signed)
Refilled sent into requested pharmacy

## 2018-05-23 ENCOUNTER — Telehealth: Payer: Self-pay | Admitting: Family Medicine

## 2018-05-30 ENCOUNTER — Telehealth: Payer: Self-pay | Admitting: Family Medicine

## 2018-05-30 NOTE — Telephone Encounter (Signed)
[place] with [employer] [phone] calling--regarding Notes needed for oxygen

## 2018-05-31 NOTE — Telephone Encounter (Signed)
Returned [employer] No answer. Left vm requesting call back. Also let her know that Dr.Hagler's last day will be tomorrow.

## 2018-06-01 NOTE — Telephone Encounter (Signed)
Spoke with [place] and she has all the info she needs.

## 2018-07-25 ENCOUNTER — Other Ambulatory Visit: Payer: Self-pay | Admitting: Family Medicine

## 2018-08-09 ENCOUNTER — Other Ambulatory Visit: Payer: Self-pay | Admitting: Family Medicine

## 2018-09-06 ENCOUNTER — Other Ambulatory Visit: Payer: Self-pay | Admitting: Family Medicine

## 2018-11-08 ENCOUNTER — Encounter (INDEPENDENT_AMBULATORY_CARE_PROVIDER_SITE_OTHER): Payer: Self-pay | Admitting: Internal Medicine

## 2018-11-08 ENCOUNTER — Ambulatory Visit (INDEPENDENT_AMBULATORY_CARE_PROVIDER_SITE_OTHER): Payer: Medicare Other | Admitting: Internal Medicine

## 2018-11-08 VITALS — BP 124/71 | HR 70 | Temp 98.4°F | Resp 18 | Ht 62.0 in | Wt 243.6 lb

## 2018-11-08 DIAGNOSIS — R1319 Other dysphagia: Secondary | ICD-10-CM

## 2018-11-08 DIAGNOSIS — L03116 Cellulitis of left lower limb: Secondary | ICD-10-CM

## 2018-11-08 DIAGNOSIS — Z862 Personal history of diseases of the blood and blood-forming organs and certain disorders involving the immune mechanism: Secondary | ICD-10-CM | POA: Diagnosis not present

## 2018-11-08 DIAGNOSIS — R1310 Dysphagia, unspecified: Secondary | ICD-10-CM

## 2018-11-08 MED ORDER — LEVOFLOXACIN 500 MG PO TABS: 500.0000 mg | ORAL_TABLET | Freq: Every day | ORAL | 0 refills | Status: DC

## 2018-11-08 NOTE — Progress Notes (Addendum)
Presenting complaint;  Follow for iron deficiency anemia and dysphagia. Patient complains of left foot swelling since injury 1 week ago.  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  Patient states she had blood work done recently at her hemoglobin was closer normal.  She denies melena or rectal bleeding.  She also denies hematuria or vaginal bleeding.  She states her bowels move daily with help.  Her appetite is up and down.  She has gained 10 pounds since her last visit.  She says on account of breathing difficulty she is not able to do much walking.  She lives at home.  Her nephew who is 38 years old stays at home with her.  She says she is able to do minimal housework and cooking. Patient states that she had the oxygen tank last week and ended up with ulcer on the dorsal aspect of her left foot.  She has noted some drainage and swelling to her foot.  She has not had fever.  She is concerned because she is diabetic.  She does not have an appointment to see Dr. Lorra Hals. She feels she is able to swallow better than she has been in the past.  She has not had any episode of food impaction since she was last seen.  She says she eats slowly.  Current Medications: Outpatient Encounter Medications as of 11/08/2018  Medication Sig  . albuterol (PROVENTIL HFA;VENTOLIN HFA) 108 (90 Base) MCG/ACT inhaler Inhale 1-2 puffs into the lungs every 4 (four) hours as needed for wheezing or shortness of breath.  Marland Kitchen albuterol (PROVENTIL) (2.5 MG/3ML) 0.083% nebulizer solution INHALE CONTENTS OF 1 VIAL BY NEBULIZER EVERY 4 HOURS AS NEEDED  . allopurinol (ZYLOPRIM) 300 MG tablet Take 1 tablet (300 mg total) by mouth daily.  Marland Kitchen aspirin EC 81 MG tablet Take 1 tablet (81 mg  total) by mouth daily.  . budesonide-formoterol (SYMBICORT) 80-4.5 MCG/ACT inhaler Inhale 2 puffs into the lungs 2 (two) times daily.  Marland Kitchen CARTIA XT 180 MG 24 hr capsule Take 1 capsule (180 mg total) by mouth daily.  . Cholecalciferol (VITAMIN D3) 5000 units TABS Take 5,000 Units by mouth daily.  . Diclofenac Sodium 3 % GEL Apply 1 application topically 4 (four) times daily as needed. For pain in legs/back.  . docusate sodium (COLACE) 100 MG capsule Take 1 capsule (100 mg total) by mouth daily.  Marland Kitchen exenatide (BYETTA) 10 MCG/0.04ML SOPN injection Inject 0.04 mLs (10 mcg total) into the skin 2 (two) times daily with a meal.  . FERREX 150 150 MG capsule Take 1 capsule (150 mg total) by mouth daily.  . furosemide (LASIX) 40 MG tablet TAKE ONE TABLET BY MOUTH TWICE DAILY.  Marland Kitchen glucose blood (IGLUCOSE TEST STRIPS) test strip Test twice daily Disp. Brand covered by insurance. Dx e11.9  . Insulin Glargine (LANTUS SOLOSTAR) 100 UNIT/ML Solostar Pen Inject 70 Units into the skin daily at 10 pm.  . isosorbide mononitrate (IMDUR) 60 MG 24 hr tablet Take 1 tablet (60 mg total) by mouth daily.  Marland Kitchen lidocaine (XYLOCAINE) 5 % ointment Apply 1-2 pumps up to four times daily-rub in well  . linaclotide (LINZESS) 145 MCG CAPS capsule Take 1 capsule (145 mcg total) by mouth daily before breakfast.  .  metFORMIN (GLUCOPHAGE) 500 MG tablet Take 2 tablets in AM and 1 tablet in PM for diabetes  . metolazone (ZAROXOLYN) 2.5 MG tablet Take 1 tablet (2.5 mg total) by mouth every other day.  . metoprolol tartrate (LOPRESSOR) 25 MG tablet Take 1 tablet (25 mg total) by mouth 2 (two) times daily.  . Multiple Vitamins-Minerals (ALIVE WOMENS ENERGY) TABS Take 1 tablet by mouth daily.  . nitroGLYCERIN (NITROSTAT) 0.4 MG SL tablet Place 0.4 mg under the tongue as directed.  . ondansetron (ZOFRAN) 4 MG tablet TAKE ONE TABLET BY MOUTH THREE TIMES DAILY AS NEEDED  . potassium chloride (MICRO-K) 10 MEQ CR capsule TAKE TWO (2) CAPSULES BY  MOUTH DAILY.  . rosuvastatin (CRESTOR) 20 MG tablet Take 1 tablet (20 mg total) by mouth daily.  Marland Kitchen UNABLE TO FIND C-BACL-GABA-IBU-PRIL - 2%-2%-5%-2%  Cream.  Apply 1-2 GM ( 1-2 pumps) up to 4 times daily, and rub in well.  Marland Kitchen UNABLE TO FIND accuchek test strips test 4 times daily, lancets test 4 times daily. Dx e11.9  . UNABLE TO FIND Diabetic shoes with inserts based on patients formulary  . [DISCONTINUED] amitriptyline (ELAVIL) 25 MG tablet Take 1 tablet (25 mg total) by mouth at bedtime.   No facility-administered encounter medications on file as of 11/08/2018.     Objective: Blood pressure 124/71, pulse 70, temperature 98.4 F (36.9 C), temperature source Oral, resp. rate 18, height 5\' 2"  (1.575 m), weight 243 lb 9.6 oz (110.5 kg). Patient is alert and in no acute distress. She is using nasal O2.  She becomes short of breath on moving from chair to examination table. Conjunctiva is pink. Sclera is nonicteric Oropharyngeal mucosa is normal. No neck masses or thyromegaly noted. Cardiac exam with regular rhythm normal S1 and S2. No murmur or gallop noted. Lungs are clear to auscultation. Abdomen is obese but soft and nontender with organomegaly or masses. Examination of her left foot reveals edema to dorsal aspect with some bluish discoloration.  She has small ulcer the distal aspect laterally.  There is scant amount of drainage.  There is no odor.  No pus came out on palpation around the wound.   Assessment:  #1.  History of iron deficiency anemia.  She has undergone work-up in the past.  She is not having any overt GI bleed.  On exam she does not appear to be pale.  It remains to be seen if her hemoglobin is normal.  #2.  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  She is coping well with this symptom.  If anything she is having less dysphagia than before.  It may be function of slow eating and the fact that she chews her food thoroughly.  #3.  Left foot cellulitis due  to trauma resulting in small ulcer.   Plan:  Request copy of recent blood work. Patient will continue Ferrex 150 mg by mouth daily. Levaquin 500 mg by mouth daily for 1 week. Patient advised to clean wound with hydrogen peroxide and apply triple antibiotic.  She was given sample pack. Patient also advised to keep leg elevated when she is sitting. Patient advised to follow-up with Dr. Lorra Hals if antibiotic therapy does not clear cellulitis. Office visit in 6 months.   Addendum  H&H 11.6 and 36.9 on 10/06/2018. Patient informed.

## 2018-11-08 NOTE — Patient Instructions (Addendum)
Will request copy of recent blood work for review. Follow-up with Dr. Lorra Hals next week if cellulitis not resolved with antibiotic. Clean wound daily with hydrogen peroxide alcohol and apply triple antibiotic ointment.

## 2018-11-14 ENCOUNTER — Encounter (INDEPENDENT_AMBULATORY_CARE_PROVIDER_SITE_OTHER): Payer: Self-pay | Admitting: *Deleted

## 2018-11-26 ENCOUNTER — Other Ambulatory Visit: Payer: Self-pay

## 2018-11-26 ENCOUNTER — Inpatient Hospital Stay (HOSPITAL_COMMUNITY)
Admission: EM | Admit: 2018-11-26 | Discharge: 2018-11-29 | DRG: 377 | Disposition: A | Discharge status: Home Health | Payer: Medicare Other | Attending: Family Medicine | Admitting: Family Medicine

## 2018-11-26 ENCOUNTER — Emergency Department (HOSPITAL_COMMUNITY): Payer: Medicare Other

## 2018-11-26 ENCOUNTER — Encounter (HOSPITAL_COMMUNITY): Payer: Self-pay | Admitting: Emergency Medicine

## 2018-11-26 DIAGNOSIS — Z79899 Other long term (current) drug therapy: Secondary | ICD-10-CM

## 2018-11-26 DIAGNOSIS — I2510 Atherosclerotic heart disease of native coronary artery without angina pectoris: Secondary | ICD-10-CM | POA: Diagnosis present

## 2018-11-26 DIAGNOSIS — Z951 Presence of aortocoronary bypass graft: Secondary | ICD-10-CM

## 2018-11-26 DIAGNOSIS — I5032 Chronic diastolic (congestive) heart failure: Secondary | ICD-10-CM | POA: Diagnosis present

## 2018-11-26 DIAGNOSIS — Z833 Family history of diabetes mellitus: Secondary | ICD-10-CM

## 2018-11-26 DIAGNOSIS — E119 Type 2 diabetes mellitus without complications: Secondary | ICD-10-CM

## 2018-11-26 DIAGNOSIS — K31819 Angiodysplasia of stomach and duodenum without bleeding: Secondary | ICD-10-CM | POA: Diagnosis present

## 2018-11-26 DIAGNOSIS — K921 Melena: Secondary | ICD-10-CM | POA: Diagnosis present

## 2018-11-26 DIAGNOSIS — Z6841 Body Mass Index (BMI) 40.0 and over, adult: Secondary | ICD-10-CM | POA: Diagnosis not present

## 2018-11-26 DIAGNOSIS — Z825 Family history of asthma and other chronic lower respiratory diseases: Secondary | ICD-10-CM | POA: Diagnosis not present

## 2018-11-26 DIAGNOSIS — I480 Paroxysmal atrial fibrillation: Secondary | ICD-10-CM | POA: Diagnosis present

## 2018-11-26 DIAGNOSIS — K922 Gastrointestinal hemorrhage, unspecified: Principal | ICD-10-CM | POA: Diagnosis present

## 2018-11-26 DIAGNOSIS — D649 Anemia, unspecified: Secondary | ICD-10-CM

## 2018-11-26 DIAGNOSIS — M549 Dorsalgia, unspecified: Secondary | ICD-10-CM | POA: Diagnosis present

## 2018-11-26 DIAGNOSIS — N179 Acute kidney failure, unspecified: Secondary | ICD-10-CM | POA: Diagnosis present

## 2018-11-26 DIAGNOSIS — J9621 Acute and chronic respiratory failure with hypoxia: Secondary | ICD-10-CM | POA: Diagnosis present

## 2018-11-26 DIAGNOSIS — D62 Acute posthemorrhagic anemia: Secondary | ICD-10-CM | POA: Diagnosis present

## 2018-11-26 DIAGNOSIS — Z8261 Family history of arthritis: Secondary | ICD-10-CM

## 2018-11-26 DIAGNOSIS — J9691 Respiratory failure, unspecified with hypoxia: Secondary | ICD-10-CM | POA: Diagnosis present

## 2018-11-26 DIAGNOSIS — Z794 Long term (current) use of insulin: Secondary | ICD-10-CM | POA: Diagnosis not present

## 2018-11-26 DIAGNOSIS — J439 Emphysema, unspecified: Secondary | ICD-10-CM | POA: Diagnosis present

## 2018-11-26 DIAGNOSIS — Z9861 Coronary angioplasty status: Secondary | ICD-10-CM | POA: Diagnosis not present

## 2018-11-26 DIAGNOSIS — Z9981 Dependence on supplemental oxygen: Secondary | ICD-10-CM | POA: Diagnosis not present

## 2018-11-26 DIAGNOSIS — N184 Chronic kidney disease, stage 4 (severe): Secondary | ICD-10-CM | POA: Diagnosis present

## 2018-11-26 DIAGNOSIS — Z7982 Long term (current) use of aspirin: Secondary | ICD-10-CM | POA: Diagnosis not present

## 2018-11-26 DIAGNOSIS — R195 Other fecal abnormalities: Secondary | ICD-10-CM | POA: Diagnosis not present

## 2018-11-26 DIAGNOSIS — E1122 Type 2 diabetes mellitus with diabetic chronic kidney disease: Secondary | ICD-10-CM | POA: Diagnosis present

## 2018-11-26 DIAGNOSIS — Z8249 Family history of ischemic heart disease and other diseases of the circulatory system: Secondary | ICD-10-CM | POA: Diagnosis not present

## 2018-11-26 DIAGNOSIS — K228 Other specified diseases of esophagus: Secondary | ICD-10-CM | POA: Diagnosis not present

## 2018-11-26 DIAGNOSIS — Z953 Presence of xenogenic heart valve: Secondary | ICD-10-CM

## 2018-11-26 DIAGNOSIS — E1121 Type 2 diabetes mellitus with diabetic nephropathy: Secondary | ICD-10-CM | POA: Diagnosis not present

## 2018-11-26 LAB — CBC
HCT: 24.1 % — ABNORMAL LOW (ref 36.0–46.0)
Hemoglobin: 7.4 g/dL — ABNORMAL LOW (ref 12.0–15.0)
MCH: 28.4 pg (ref 26.0–34.0)
MCHC: 30.7 g/dL (ref 30.0–36.0)
MCV: 92.3 fL (ref 80.0–100.0)
Platelets: 230 10*3/uL (ref 150–400)
RBC: 2.61 MIL/uL — ABNORMAL LOW (ref 3.87–5.11)
RDW: 15.1 % (ref 11.5–15.5)
WBC: 11.2 10*3/uL — ABNORMAL HIGH (ref 4.0–10.5)
nRBC: 0.4 % — ABNORMAL HIGH (ref 0.0–0.2)

## 2018-11-26 LAB — POC OCCULT BLOOD, ED: Fecal Occult Bld: POSITIVE — AB

## 2018-11-26 LAB — GLUCOSE, CAPILLARY
GLUCOSE-CAPILLARY: 141 mg/dL — AB (ref 70–99)
Glucose-Capillary: 122 mg/dL — ABNORMAL HIGH (ref 70–99)

## 2018-11-26 LAB — TROPONIN I: Troponin I: <0.03 ng/mL (ref <0.03)

## 2018-11-26 LAB — CBC WITH DIFFERENTIAL/PLATELET
Abs Immature Granulocytes: 0.14 10*3/uL — ABNORMAL HIGH (ref 0.00–0.07)
Basophils Absolute: 0.1 10*3/uL (ref 0.0–0.1)
Basophils Relative: 0 %
Eosinophils Absolute: 0 10*3/uL (ref 0.0–0.5)
Eosinophils Relative: 0 %
HCT: 24.3 % — ABNORMAL LOW (ref 36.0–46.0)
Hemoglobin: 7.2 g/dL — ABNORMAL LOW (ref 12.0–15.0)
Immature Granulocytes: 1 %
Lymphocytes Relative: 11 %
Lymphs Abs: 1.7 10*3/uL (ref 0.7–4.0)
MCH: 27.7 pg (ref 26.0–34.0)
MCHC: 29.6 g/dL — ABNORMAL LOW (ref 30.0–36.0)
MCV: 93.5 fL (ref 80.0–100.0)
Monocytes Absolute: 1 10*3/uL (ref 0.1–1.0)
Monocytes Relative: 7 %
Neutro Abs: 13.1 10*3/uL — ABNORMAL HIGH (ref 1.7–7.7)
Neutrophils Relative %: 81 %
Platelets: 289 10*3/uL (ref 150–400)
RBC: 2.6 MIL/uL — ABNORMAL LOW (ref 3.87–5.11)
RDW: 15.6 % — ABNORMAL HIGH (ref 11.5–15.5)
WBC: 16.1 10*3/uL — ABNORMAL HIGH (ref 4.0–10.5)
nRBC: 0.5 % — ABNORMAL HIGH (ref 0.0–0.2)

## 2018-11-26 LAB — BLOOD GAS, ARTERIAL
Acid-Base Excess: 1.5 mmol/L (ref 0.0–2.0)
Bicarbonate: 25 mmol/L (ref 20.0–28.0)
FIO2: 32
O2 Saturation: 42.6 %
PH ART: 7.361 (ref 7.350–7.450)
Patient temperature: 37.2
pCO2 arterial: 47.7 mmHg (ref 32.0–48.0)
pO2, Arterial: <31 mmHg — CL (ref 83.0–108.0)

## 2018-11-26 LAB — BASIC METABOLIC PANEL
Anion gap: 12 (ref 5–15)
BUN: 50 mg/dL — ABNORMAL HIGH (ref 8–23)
CO2: 22 mmol/L (ref 22–32)
Calcium: 8.7 mg/dL — ABNORMAL LOW (ref 8.9–10.3)
Chloride: 100 mmol/L (ref 98–111)
Creatinine, Ser: 2.57 mg/dL — ABNORMAL HIGH (ref 0.44–1.00)
GFR calc Af Amer: 20 mL/min — ABNORMAL LOW (ref >60)
GFR calc non Af Amer: 17 mL/min — ABNORMAL LOW (ref >60)
Glucose, Bld: 180 mg/dL — ABNORMAL HIGH (ref 70–99)
Potassium: 4.6 mmol/L (ref 3.5–5.1)
Sodium: 134 mmol/L — ABNORMAL LOW (ref 135–145)

## 2018-11-26 LAB — PREPARE RBC (CROSSMATCH)

## 2018-11-26 LAB — ABO/RH: ABO/RH(D): O NEG

## 2018-11-26 LAB — CBG MONITORING, ED: Glucose-Capillary: 167 mg/dL — ABNORMAL HIGH (ref 70–99)

## 2018-11-26 LAB — TSH: TSH: 1.68 u[IU]/mL (ref 0.350–4.500)

## 2018-11-26 LAB — BRAIN NATRIURETIC PEPTIDE: B Natriuretic Peptide: 347 pg/mL — ABNORMAL HIGH (ref 0.0–100.0)

## 2018-11-26 LAB — MAGNESIUM: Magnesium: 2.5 mg/dL — ABNORMAL HIGH (ref 1.7–2.4)

## 2018-11-26 MED ORDER — TRAZODONE HCL 50 MG PO TABS
50.0000 mg | ORAL_TABLET | Freq: Every evening | ORAL | Status: DC | PRN
  Administered 2018-11-26 – 2018-11-27 (×2): 50 mg via ORAL
  Filled 2018-11-26 (×2): qty 1

## 2018-11-26 MED ORDER — ACETAMINOPHEN 325 MG PO TABS
650.0000 mg | ORAL_TABLET | Freq: Four times a day (QID) | ORAL | Status: DC | PRN
  Administered 2018-11-28: 650 mg via ORAL
  Filled 2018-11-26: qty 2

## 2018-11-26 MED ORDER — AZITHROMYCIN 250 MG PO TABS
500.0000 mg | ORAL_TABLET | Freq: Once | ORAL | Status: AC
  Administered 2018-11-26: 500 mg via ORAL
  Filled 2018-11-26 (×2): qty 2

## 2018-11-26 MED ORDER — SODIUM CHLORIDE 0.9 % IV SOLN
INTRAVENOUS | Status: DC
  Administered 2018-11-26: 19:00:00 via INTRAVENOUS

## 2018-11-26 MED ORDER — DILTIAZEM HCL ER COATED BEADS 180 MG PO CP24
180.0000 mg | ORAL_CAPSULE | Freq: Every day | ORAL | Status: DC
  Filled 2018-11-26: qty 1

## 2018-11-26 MED ORDER — OXYCODONE HCL 5 MG PO TABS: 5.0000 mg | ORAL_TABLET | Freq: Four times a day (QID) | ORAL | Status: DC | PRN

## 2018-11-26 MED ORDER — FUROSEMIDE 10 MG/ML IJ SOLN
40.0000 mg | Freq: Once | INTRAMUSCULAR | Status: DC
  Filled 2018-11-26: qty 4

## 2018-11-26 MED ORDER — ONDANSETRON HCL 4 MG/2ML IJ SOLN
INTRAMUSCULAR | Status: AC
  Filled 2018-11-26: qty 2

## 2018-11-26 MED ORDER — CHLORHEXIDINE GLUCONATE CLOTH 2 % EX PADS: 6.0000 | MEDICATED_PAD | Freq: Every day | CUTANEOUS | Status: DC

## 2018-11-26 MED ORDER — IPRATROPIUM BROMIDE 0.02 % IN SOLN
0.5000 mg | Freq: Once | RESPIRATORY_TRACT | Status: AC
  Administered 2018-11-26: 0.5 mg via RESPIRATORY_TRACT
  Filled 2018-11-26: qty 2.5

## 2018-11-26 MED ORDER — TRAMADOL HCL 50 MG PO TABS
50.0000 mg | ORAL_TABLET | Freq: Two times a day (BID) | ORAL | Status: DC | PRN
  Administered 2018-11-26 – 2018-11-27 (×2): 50 mg via ORAL
  Filled 2018-11-26 (×2): qty 1

## 2018-11-26 MED ORDER — SODIUM CHLORIDE 0.9% FLUSH: 3.0000 mL | INTRAVENOUS | Status: DC | PRN

## 2018-11-26 MED ORDER — INSULIN GLARGINE 100 UNIT/ML SOLOSTAR PEN: 40.0000 [IU] | PEN_INJECTOR | Freq: Every day | SUBCUTANEOUS | Status: DC

## 2018-11-26 MED ORDER — INSULIN GLARGINE 100 UNIT/ML SOLOSTAR PEN: 70.0000 [IU] | PEN_INJECTOR | Freq: Every day | SUBCUTANEOUS | Status: DC

## 2018-11-26 MED ORDER — SODIUM CHLORIDE 0.9 % IV SOLN: 10.0000 mL/h | Freq: Once | INTRAVENOUS | Status: DC

## 2018-11-26 MED ORDER — ONDANSETRON HCL 4 MG/2ML IJ SOLN
4.0000 mg | Freq: Four times a day (QID) | INTRAMUSCULAR | Status: DC | PRN
  Administered 2018-11-26: 4 mg via INTRAVENOUS

## 2018-11-26 MED ORDER — ACETAMINOPHEN 650 MG RE SUPP: 650.0000 mg | Freq: Four times a day (QID) | RECTAL | Status: DC | PRN

## 2018-11-26 MED ORDER — FUROSEMIDE 10 MG/ML IJ SOLN
40.0000 mg | Freq: Once | INTRAMUSCULAR | Status: AC
  Administered 2018-11-26: 40 mg via INTRAVENOUS
  Filled 2018-11-26: qty 4

## 2018-11-26 MED ORDER — INSULIN ASPART 100 UNIT/ML ~~LOC~~ SOLN
0.0000 [IU] | SUBCUTANEOUS | Status: DC
  Administered 2018-11-28: 3 [IU] via SUBCUTANEOUS
  Administered 2018-11-28 – 2018-11-29 (×3): 1 [IU] via SUBCUTANEOUS

## 2018-11-26 MED ORDER — ALBUTEROL SULFATE (2.5 MG/3ML) 0.083% IN NEBU
5.0000 mg | INHALATION_SOLUTION | Freq: Once | RESPIRATORY_TRACT | Status: AC
  Administered 2018-11-26: 5 mg via RESPIRATORY_TRACT
  Filled 2018-11-26: qty 6

## 2018-11-26 MED ORDER — PANTOPRAZOLE SODIUM 40 MG IV SOLR
40.0000 mg | Freq: Two times a day (BID) | INTRAVENOUS | Status: DC
  Administered 2018-11-26 – 2018-11-29 (×7): 40 mg via INTRAVENOUS
  Filled 2018-11-26 (×7): qty 40

## 2018-11-26 MED ORDER — LIDOCAINE 5 % EX OINT
TOPICAL_OINTMENT | Freq: Three times a day (TID) | CUTANEOUS | Status: DC | PRN
  Filled 2018-11-26: qty 35.44

## 2018-11-26 MED ORDER — ISOSORBIDE MONONITRATE ER 60 MG PO TB24
60.0000 mg | ORAL_TABLET | Freq: Every day | ORAL | Status: DC
  Filled 2018-11-26: qty 1

## 2018-11-26 MED ORDER — INSULIN GLARGINE 100 UNIT/ML ~~LOC~~ SOLN
40.0000 [IU] | Freq: Every day | SUBCUTANEOUS | Status: DC
  Administered 2018-11-26: 40 [IU] via SUBCUTANEOUS
  Filled 2018-11-26 (×2): qty 0.4

## 2018-11-26 MED ORDER — POLYETHYLENE GLYCOL 3350 17 G PO PACK: 17.0000 g | PACK | Freq: Every day | ORAL | Status: DC | PRN

## 2018-11-26 MED ORDER — ONDANSETRON HCL 4 MG PO TABS: 4.0000 mg | ORAL_TABLET | Freq: Four times a day (QID) | ORAL | Status: DC | PRN

## 2018-11-26 MED ORDER — ALBUTEROL (5 MG/ML) CONTINUOUS INHALATION SOLN
10.0000 mg/h | INHALATION_SOLUTION | RESPIRATORY_TRACT | Status: DC
  Administered 2018-11-26: 10 mg/h via RESPIRATORY_TRACT
  Filled 2018-11-26: qty 20

## 2018-11-26 MED ORDER — SODIUM CHLORIDE 0.9 % IV SOLN: 250.0000 mL | INTRAVENOUS | Status: DC | PRN

## 2018-11-26 MED ORDER — SODIUM CHLORIDE 0.9 % IV SOLN
1.0000 g | Freq: Once | INTRAVENOUS | Status: AC
  Administered 2018-11-26: 1 g via INTRAVENOUS
  Filled 2018-11-26: qty 10

## 2018-11-26 MED ORDER — SODIUM CHLORIDE 0.9% FLUSH
3.0000 mL | Freq: Two times a day (BID) | INTRAVENOUS | Status: DC
  Administered 2018-11-27 – 2018-11-29 (×5): 3 mL via INTRAVENOUS

## 2018-11-26 MED ORDER — AMITRIPTYLINE HCL 25 MG PO TABS: 25.0000 mg | ORAL_TABLET | Freq: Every evening | ORAL | Status: DC | PRN

## 2018-11-26 MED ORDER — ALBUTEROL SULFATE (2.5 MG/3ML) 0.083% IN NEBU
2.5000 mg | INHALATION_SOLUTION | RESPIRATORY_TRACT | Status: DC | PRN
  Administered 2018-11-27: 2.5 mg via RESPIRATORY_TRACT
  Filled 2018-11-26 (×2): qty 3

## 2018-11-26 MED ORDER — ROSUVASTATIN CALCIUM 20 MG PO TABS
20.0000 mg | ORAL_TABLET | Freq: Every day | ORAL | Status: DC
  Administered 2018-11-27 – 2018-11-29 (×3): 20 mg via ORAL
  Filled 2018-11-26 (×3): qty 1

## 2018-11-26 MED ORDER — METOPROLOL TARTRATE 25 MG PO TABS
25.0000 mg | ORAL_TABLET | Freq: Two times a day (BID) | ORAL | Status: DC
  Administered 2018-11-26: 25 mg via ORAL
  Filled 2018-11-26: qty 1

## 2018-11-26 NOTE — Progress Notes (Addendum)
Patient off BiPAP on 4 lpm high flow saturation 97. Patient talking without problems wanting something to eat but is NPO for blood in stool. BiPAP is on stand by most likely will not need. Also patient wears 3 liter oxygen at home [facility] Chronic.

## 2018-11-26 NOTE — ED Triage Notes (Signed)
Patient c/o shortness of breath x1 week that is progressively getting worse. Patient is a COPD patient and wears 3 liters of oxygen via [place] continuously. Patient uses inhaler-last used this morning with no relief.  Patient denies fever or cough. Per patient intermittent mid-sternal chest pain/burning. Patient did report vomiting x1 yesterday.

## 2018-11-26 NOTE — ED Notes (Signed)
Able to restart iv to right forearm but IV site infiltrated as well once IV antibiotics started, attempted a second time but IV blew. Another nurse to assess for access

## 2018-11-26 NOTE — H&P (Signed)
Patient Demographics:    Rachel Suarez, is a 78 y.o. 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 Hemoccult positive..... GI consult pending,, keep n.p.o. except for meds, give Protonix 40 IV twice daily, Risk, benefits and alternatives to transfusion of blood products discussed. Indication for transfusion discussed. Consent obtained Please Transfuse 2 units of packed red blood cells, please give each unit of PRBC over 3 hours, please give Lasix 40 mg IV x1 after first unit of packed cells is infused   2) acute on chronic hypoxic respiratory failure in a patient with underlying COPD/emphysema--- baseline patient uses 3 L of oxygen at home now requiring BiPAP with increased work of breathing most likely due to significant anemia as noted above #1--- okay to give bronchodilators and BiPAP for respiratory support..... 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 due to GI bleed and BiPAP use, Allow some permissive Hyperglycemia rather than risk life-threatening hypoglycemia in a patient with unreliable oral intake. Use Novolog/Humalog Sliding scale insulin with Accu-Cheks/Fingersticks as ordered--- will reduce Lantus insulin to 40 units from 70 units every hs, hold metformin due to kidney concerns, hold Byetta  5)CAD--- patient with history of CAD status post previous PCI and CABG of LAD in 1993--- hold aspirin due to GI bleed, no ACS symptoms at this time, continue metoprolol 25 mg twice daily, continue isosorbide 60 mg daily, continue Crestor 20 mg daily  6)H/o aortic stenosis--- status post previous TAVR in February 2017--- echo from 10/04/2018 with normal functioning aortic valve, EF 60 to 65%  7) leukocytosis--- hold off on further antibiotics at this time, will obtain blood and urine cultures  8)right upper lobe nodular opacity ----- which will  require further characterization with CT with contrast which we cannot  do now due to kidney function   With History of - Reviewed by me  Past Medical History:  Diagnosis Date   Allergy    Anemia    Arthritis    Asthma    Cataract    CHF (congestive heart failure) (HCC)    CKD (chronic kidney disease) stage 3, GFR 30-59 ml/min ([place]) 12/11/2016   COPD (chronic obstructive pulmonary disease) (HCC)    Diabetes (HCC)    x 30 yrs   Emphysema of lung (HCC)    Essential hypertension 09/21/2011   GERD (gastroesophageal reflux disease)    Heart disease    High cholesterol    Oxygen deficiency       Past Surgical History:  Procedure Laterality Date   AORTIC  VALVE REPLACEMENT     APPENDECTOMY     CHOLECYSTECTOMY     COLONOSCOPY N/A 03/29/2015   Procedure: COLONOSCOPY;  Surgeon: Rogene Houston, MD;  [facility];  Service: Endoscopy;  Laterality: N/A;  1015 - moved to 1:10 - Ann to notify   COLONOSCOPY N/A 06/24/2016   Procedure: COLONOSCOPY;  Surgeon: Rogene Houston, MD;  Location: [facility];  Service: Endoscopy;  Laterality: N/A;  245 - moved to 10/18 @ 1:00 - Ann to notify pt   CORONARY ARTERY BYPASS GRAFT  1993   ESOPHAGEAL DILATION N/A 03/29/2015   Procedure: ESOPHAGEAL DILATION;  Surgeon: Rogene Houston, MD;  [facility]: [facility];  Service: Endoscopy;  Laterality: N/A;   ESOPHAGOGASTRODUODENOSCOPY N/A 03/29/2015   Procedure: ESOPHAGOGASTRODUODENOSCOPY (EGD);  Surgeon: Rogene Houston, MD;  [facility];  Service: Endoscopy;  Laterality: N/A;   GIVENS CAPSULE STUDY N/A 12/25/2015   Procedure: GIVENS CAPSULE STUDY;  Surgeon: Rogene Houston, MD;  [facility];  Service: Endoscopy;  Laterality: N/A;      Chief Complaint  Patient presents with   Shortness of Breath      HPI:    Rachel Suarez  is a 78 y.o. female with past medical history relevant for CAD with previous PCI to LAD, CABG to LAD 1993, CKD stage III-IV (baseline creatinine previously around 1.3 but recently as high as 2.8 on 10/11/2018), status post TAVR February 2017, HFpEF with EF of 60 to 65% per echo from 10/04/2018, chronic anemia of multifactorial etiology including CKD requiring transfusion from time to time, well as history of diabetes mellitus, hypertension and chronic hypoxic respiratory failure secondary to COPD/emphysema presenting to the ED with worsening shortness of breath for the last week or so as well as complains of dark stools.... She does take iron tablets however--  In ED... She is found to have conversational dyspnea, tachypnea and increased work of breathing, required BiPAP for extended period of time, RT attempted  ABG but only got venous blood  In ED... Hemoglobin is down to 7.2 from a baseline of between 11 and 12, stool Hemoccult is positive  No fevers no chills, .. No productive cough...   Patient had nausea from BiPAP use but no emesis, no diarrhea  She had chest discomfort and dyspnea but no frank chest pain, complains of knee discomfort but no calf or leg pains per se patient has chronic lower extremity edema which is unchanged from a baseline.... No pleuritic symptoms  In ED--- chest x-ray without overt CHF however patient does have cardiomegaly, there is right upper lobe nodular opacity which will  require further characterization with CT with contrast which we cannot do now due  to kidney function  TSH is 1.6, mag is 2.5, BNP is 347 without prior baseline.... Creatinine is 2.57, previous creatinine baseline was around 1.3, however on 10/11/2018 at [facility] patient's creatinine was 2.8, CBC with a white count of 16.1..... Patient appears to have chronic leukocytosis     Review of systems:    In addition to the HPI above,   A full Review of  Systems was done, all other systems reviewed are negative except as noted above in HPI , .    Social History:  Reviewed by me    Social History   Tobacco Use   Smoking status: Never Smoker   Smokeless tobacco: Never Used  Substance Use Topics   Alcohol use: No    Alcohol/week: 0.0 standard drinks       Family History :  Reviewed by me    Family History  Problem Relation Age of Onset   Heart disease Mother    Arthritis Mother    Asthma Mother    Diabetes Mother    Heart disease Father    Arthritis Father    Diabetes Father    Heart disease Brother    Pneumonia Brother      Home Medications:   Prior to Admission medications   Medication Sig Start Date End Date Taking? Authorizing Provider  albuterol (PROVENTIL HFA;VENTOLIN HFA) 108 (90 Base) MCG/ACT inhaler Inhale 1-2 puffs into the lungs every 4  (four) hours as needed for wheezing or shortness of breath. 03/22/18  Yes Hagler, Apolonio Schneiders, MD  albuterol (PROVENTIL) (2.5 MG/3ML) 0.083% nebulizer solution INHALE CONTENTS OF 1 VIAL BY NEBULIZER EVERY 4 HOURS AS NEEDED 03/22/18  Yes Hagler, Apolonio Schneiders, MD  amitriptyline (ELAVIL) 25 MG tablet Take 25 mg by mouth at bedtime as needed (pain).   Yes [provider]  budesonide-formoterol (SYMBICORT) 80-4.5 MCG/ACT inhaler Inhale 2 puffs into the lungs 2 (two) times daily. 03/22/18  Yes Hagler, Apolonio Schneiders, MD  CARTIA XT 180 MG 24 hr capsule Take 1 capsule (180 mg total) by mouth daily. 03/22/18  Yes Hagler, Apolonio Schneiders, MD  Diclofenac Sodium 3 % GEL Apply 1 application topically 4 (four) times daily as needed. For pain in legs/back. 03/22/18  Yes Hagler, Apolonio Schneiders, MD  docusate sodium (COLACE) 100 MG capsule Take 1 capsule (100 mg total) by mouth daily. 04/19/18  Yes Caren Macadam, MD  exenatide (BYETTA) 10 MCG/0.04ML SOPN injection Inject 0.04 mLs (10 mcg total) into the skin 2 (two) times daily with a meal. 03/22/18  Yes Hagler, Apolonio Schneiders, MD  FERREX 150 150 MG capsule Take 1 capsule (150 mg total) by mouth daily. 03/22/18  Yes Caren Macadam, MD  furosemide (LASIX) 40 MG tablet TAKE ONE TABLET BY MOUTH TWICE DAILY. Patient taking differently: 40 mg daily.  04/11/18  Yes Hagler, Apolonio Schneiders, MD  glucose blood (IGLUCOSE TEST STRIPS) test strip Test twice daily Disp. Brand covered by insurance. Dx e11.9 03/22/18  Yes Caren Macadam, MD  Insulin Glargine (LANTUS SOLOSTAR) 100 UNIT/ML Solostar Pen Inject 70 Units into the skin daily at 10 pm. 03/22/18  Yes Hagler, Apolonio Schneiders, MD  lidocaine (XYLOCAINE) 5 % ointment Apply 1-2 pumps up to four times daily-rub in well 05/19/18  Yes Hagler, Apolonio Schneiders, MD  linaclotide [facility]) 145 MCG CAPS capsule Take 1 capsule (145 mcg total) by mouth daily before breakfast. 04/19/18  Yes Caren Macadam, MD  metFORMIN (GLUCOPHAGE) 500 MG tablet Take 2 tablets in AM and 1 tablet in PM for diabetes 03/22/18  Yes  Caren Macadam, MD  metolazone (ZAROXOLYN) 2.5 MG tablet Take 1 tablet by mouth daily. 09/15/18  Yes [provider]  metoprolol tartrate (LOPRESSOR) 25 MG tablet Take 1 tablet (25 mg total) by mouth 2 (two) times daily. 03/22/18  Yes Caren Macadam, MD  Multiple Vitamins-Minerals (ALIVE WOMENS ENERGY) TABS Take 1 tablet by mouth daily.   Yes [provider]  nitroGLYCERIN (NITROSTAT) 0.4 MG SL tablet Place 0.4 mg under the tongue as directed.   Yes [provider]  ondansetron (ZOFRAN) 4 MG tablet TAKE ONE TABLET BY MOUTH THREE TIMES DAILY AS NEEDED 04/22/18  Yes Hagler, Apolonio Schneiders, MD  potassium chloride (MICRO-K) 10 MEQ CR capsule TAKE TWO (2) CAPSULES BY MOUTH DAILY. 05/19/18  Yes Hagler, Apolonio Schneiders, MD  rosuvastatin (CRESTOR) 20 MG tablet Take 1 tablet (20 mg total) by mouth daily. 11/10/17  Yes Raylene Everts, MD  sulfamethoxazole-trimethoprim (BACTRIM DS,SEPTRA DS) 800-160 MG tablet Take 1 tablet by mouth 2 (two) times daily. Take one tablet by mouth twice daily for 7 days 11/22/18  Yes [provider]  traMADol (ULTRAM) 50 MG tablet Take 50 mg by mouth every 12 (twelve) hours as needed.   Yes [provider]  UNABLE TO FIND C-BACL-GABA-IBU-PRIL - 2%-2%-5%-2%  Cream.  Apply 1-2 GM ( 1-2 pumps) up to 4 times daily, and rub in well. 09/24/17  Yes Raylene Everts, MD  UNABLE TO FIND accuchek test strips test 4 times daily, lancets test 4 times daily. Dx e11.9 10/20/17  Yes Raylene Everts, MD  UNABLE TO FIND Diabetic shoes with inserts based on patients formulary 05/03/18  Yes Caren Macadam, MD  aspirin EC 81 MG tablet Take 1 tablet (81 mg total) by mouth daily. 06/28/16   Rogene Houston, MD  Cholecalciferol (VITAMIN D3) 5000 units TABS Take 5,000 Units by mouth daily.    [provider]  isosorbide mononitrate (IMDUR) 60 MG 24 hr tablet Take 1 tablet (60 mg total) by mouth daily. 03/22/18   Caren Macadam, MD  levofloxacin (LEVAQUIN) 500 MG tablet  Take 1 tablet (500 mg total) by mouth daily. Patient not taking: Reported on 11/26/2018 11/08/18   Rogene Houston, MD  lisinopril (PRINIVIL,ZESTRIL) 2.5 MG tablet Take 1 tablet by mouth daily. Take 1 tablet by mouth daily for kidney protection 10/05/18   [provider]     Allergies:     Allergies  Allergen Reactions   Levofloxacin Swelling    Patient states that she had Swelling in her hands , feet, numbness, and felt like bees were stinging her.     Physical Exam:   Vitals  Blood pressure (!) 108/46, pulse 89, temperature 98.5 F (36.9 C), temperature source Oral, resp. rate 17, height 5\' 2"  (1.575 m), weight 104.3 kg, SpO2 97 %.  Physical Examination: General appearance - alert, conversational dyspnea, increased work of breathing requiring BiPAP  Nose/Mouth--- BiPAP mask mental status - alert, oriented to person, place, and time,  Eyes - sclera anicteric Neck - supple, no JVD elevation , Chest -diminished in bases, no wheezing or rales  heart - S1 and S2 normal, regular , CABG Abdomen - soft, nontender, nondistended, no masses or organomegaly Neurological - screening mental status exam normal, neck supple without rigidity, cranial nerves II through XII intact, DTR's normal and symmetric Extremities -1 + pitting edema pedal edema noted, intact peripheral pulses  Skin - warm, dry     Data Review:    CBC Recent Labs  Lab 11/26/18  1302  WBC 16.1*  HGB 7.2*  HCT 24.3*  PLT 289  MCV 93.5  MCH 27.7  MCHC 29.6*  RDW 15.6*  LYMPHSABS 1.7  MONOABS 1.0  EOSABS 0.0  BASOSABS 0.1   ------------------------------------------------------------------------------------------------------------------  Chemistries  Recent Labs  Lab 11/26/18 1302  NA 134*  K 4.6  CL 100  CO2 22  GLUCOSE 180*  BUN 50*  CREATININE 2.57*  CALCIUM 8.7*  MG 2.5*    ------------------------------------------------------------------------------------------------------------------ estimated creatinine clearance is 21.1 mL/min (A) (by C-G formula based on SCr of 2.57 mg/dL (H)). ------------------------------------------------------------------------------------------------------------------ Recent Labs    11/26/18 1302  TSH 1.680     Coagulation profile No results for input(s): INR, PROTIME in the last 168 hours. ------------------------------------------------------------------------------------------------------------------- No results for input(s): DDIMER in the last 72 hours. -------------------------------------------------------------------------------------------------------------------  Cardiac Enzymes Recent Labs  Lab 11/26/18 1302  TROPONINI <0.03   ------------------------------------------------------------------------------------------------------------------    Component Value Date/Time   BNP 347.0 (H) 11/26/2018 1302   --------------------------------------------------------------------------------------------------------------  Urinalysis    Component Value Date/Time   COLORURINE YELLOW 10/13/2017 1119   APPEARANCEUR CLEAR 10/13/2017 1119   LABSPEC 1.018 10/13/2017 1119   PHURINE 6.0 10/13/2017 1119   GLUCOSEU NEGATIVE 10/13/2017 1119   HGBUR NEGATIVE 10/13/2017 1119   KETONESUR NEGATIVE 10/13/2017 1119   PROTEINUR TRACE (A) 10/13/2017 1119   NITRITE NEGATIVE 10/13/2017 1119   LEUKOCYTESUR NEGATIVE 10/13/2017 1119    ----------------------------------------------------------------------------------------------------------------   Imaging Results:    Dg Chest Portable 1 View  Result Date: 11/26/2018 CLINICAL DATA:  Worsening shortness of breath over the past week. History of COPD. EXAM: PORTABLE CHEST 1 VIEW COMPARISON:  Single-view of the chest 10/19/2016 and 10/08/2015. PA and lateral chest 10/13/2016.  FINDINGS: Focal nodular opacity is seen in the right upper lung zone. The lungs otherwise appear clear. No pneumothorax. Blunted appearance of the costophrenic angles is likely due to portable technique and the patient's body habitus. Cardiomegaly is seen. IMPRESSION: Right upper lobe nodular opacity can not be definitively characterized. Recommend CT chest with contrast for further evaluation. Marked cardiomegaly. Electronically Signed   By: Inge Rise M.D.   On: 11/26/2018 13:17    Radiological Exams on Admission: Dg Chest Portable 1 View  Result Date: 11/26/2018 CLINICAL DATA:  Worsening shortness of breath over the past week. History of COPD. EXAM: PORTABLE CHEST 1 VIEW COMPARISON:  Single-view of the chest 10/19/2016 and 10/08/2015. PA and lateral chest 10/13/2016. FINDINGS: Focal nodular opacity is seen in the right upper lung zone. The lungs otherwise appear clear. No pneumothorax. Blunted appearance of the costophrenic angles is likely due to portable technique and the patient's body habitus. Cardiomegaly is seen. IMPRESSION: Right upper lobe nodular opacity can not be definitively characterized. Recommend CT chest with contrast for further evaluation. Marked cardiomegaly. Electronically Signed   By: Inge Rise M.D.   On: 11/26/2018 13:17    DVT Prophylaxis -SCD  (gi bleed) AM Labs Ordered, also please review Full Orders  Family Communication: Admission, patients condition and plan of care including tests being ordered have been discussed with the patient who indicate understanding and agree with the plan   Code Status - Full Code  Likely DC to  TBD  Condition   fair Roxan Hockey M.D on 11/26/2018 at 6:04 PM Go to [email or web address] -  for contact info  [facility] - Office  [phone]

## 2018-11-26 NOTE — ED Provider Notes (Signed)
[facility] EMERGENCY DEPARTMENT Provider Note   CSN: [id number] Arrival date & time: 11/26/18  1232    History   Chief Complaint Chief Complaint  Patient presents with  . Shortness of Breath    HPI Rachel Suarez is a 78 y.o. female.     HPI   78 year old female with dyspnea.  Worsening over the past week.  Past history of COPD and CHF.  She has a baseline oxygen requirement of 3 L.  Occasional nonproductive cough.  Chest feels tight.  No fevers or chills.  Some lower extremity edema but she reports that this is stable.  She reports compliance with her medications.    Past Medical History:  Diagnosis Date  . Allergy   . Anemia   . Arthritis   . Asthma   . Cataract   . CHF (congestive heart failure) ([place])   . CKD (chronic kidney disease) stage 3, GFR 30-59 ml/min (HCC) 12/11/2016  . COPD (chronic obstructive pulmonary disease) ([place])   . Diabetes ([place])    x 30 yrs  . Emphysema of lung ([place])   . Essential hypertension 09/21/2011  . GERD (gastroesophageal reflux disease)   . Heart disease   . High cholesterol   . Oxygen deficiency     Patient Active Problem List   Diagnosis Date Noted  . Morbid obesity ([place]) 05/25/2017  . Oxygen dependent 05/25/2017  . CKD (chronic kidney disease) stage 3, GFR 30-59 ml/min (HCC) 12/11/2016  . Gout 12/11/2016  . Vitamin D deficiency 07/28/2016  . Hx of colonic polyps 04/20/2016  . Anemia 12/18/2015  . 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 Heart disease 03/06/2015  . DM2 (diabetes mellitus, type 2) ([place]) 03/06/2015  . GERD (gastroesophageal reflux disease) 03/06/2015  . Family hx of colon cancer 03/06/2015  . High cholesterol 03/06/2015  . Congestive heart failure (CHF) ([place]) 09/21/2011  . Essential hypertension 09/21/2011    Past Surgical History:  Procedure Laterality Date  . AORTIC VALVE REPLACEMENT    . APPENDECTOMY    . CHOLECYSTECTOMY    . COLONOSCOPY N/A 03/29/2015   Procedure:  COLONOSCOPY;  Surgeon: Rogene Houston, MD;  Location: [facility] ENDO SUITE;  Service: Endoscopy;  Laterality: N/A;  1015 - moved to 1:10 - Ann to notify  . COLONOSCOPY N/A 06/24/2016   Procedure: COLONOSCOPY;  Surgeon: Rogene Houston, MD;  Location: [facility] ENDO SUITE;  Service: Endoscopy;  Laterality: N/A;  245 - moved to 10/18 @ 1:00 - Ann to notify pt  . CORONARY ARTERY BYPASS GRAFT  1993  . ESOPHAGEAL DILATION N/A 03/29/2015   Procedure: ESOPHAGEAL DILATION;  Surgeon: Rogene Houston, MD;  Location: [facility] ENDO SUITE;  Service: Endoscopy;  Laterality: N/A;  . ESOPHAGOGASTRODUODENOSCOPY N/A 03/29/2015   Procedure: ESOPHAGOGASTRODUODENOSCOPY (EGD);  Surgeon: Rogene Houston, MD;  Location: [facility] END[facility] SUITE;  Service: Endoscopy;  Laterality: N/A;  . GIVENS CAPSULE STUDY N/A 12/25/2015   Procedure: GIVENS CAPSULE STUDY;  Surgeon: Rogene Houston, MD;  Location: [facility] END[facility];  Service: Endoscopy;  Laterality: N/A;     OB History   No obstetric history on file.      Home Medications    Prior to Admission medications   Medication Sig Start Date End Date Taking? Authorizing Provider  albuterol (PROVENTIL HFA;VENTOLIN HFA) 108 (90 Base) MCG/ACT inhaler Inhale 1-2 puffs into the lungs every 4 (four) hours as needed for wheezing or shortness of breath. 03/22/18   Caren Macadam, MD  albuterol (PROVENTIL) (2.5 MG/3ML) 0.083% nebulizer solution INHALE CONTENTS OF 1 VIAL BY NEBULIZER EVERY 4 HOURS AS NEEDED 03/22/18   Caren Macadam, MD  allopurinol (ZYLOPRIM) 300 MG tablet Take 1 tablet (300 mg total) by mouth daily. 03/22/18   Caren Macadam, MD  aspirin EC 81 MG tablet Take 1 tablet (81 mg total) by mouth daily. 06/28/16   Rogene Houston, MD  budesonide-formoterol (SYMBICORT) 80-4.5 MCG/ACT inhaler Inhale 2 puffs into the lungs 2 (two) times daily. 03/22/18   Caren Macadam, MD  CARTIA XT 180 MG 24 hr capsule Take 1 capsule (180 mg total) by mouth daily. 03/22/18   Caren Macadam, MD  Cholecalciferol (VITAMIN D3)  5000 units TABS Take 5,000 Units by mouth daily.    [provider]  Diclofenac Sodium 3 % GEL Apply 1 application topically 4 (four) times daily as needed. For pain in legs/back. 03/22/18   Caren Macadam, MD  docusate sodium (COLACE) 100 MG capsule Take 1 capsule (100 mg total) by mouth daily. 04/19/18   Caren Macadam, MD  exenatide (BYETTA) 10 MCG/0.04ML SOPN injection Inject 0.04 mLs (10 mcg total) into the skin 2 (two) times daily with a meal. 03/22/18   Caren Macadam, MD  FERREX 150 150 MG capsule Take 1 capsule (150 mg total) by mouth daily. 03/22/18   Caren Macadam, MD  furosemide (LASIX) 40 MG tablet TAKE ONE TABLET BY MOUTH TWICE DAILY. Patient taking differently: 40 mg daily.  04/11/18   Caren Macadam, MD  glucose blood (IGLUCOSE TEST STRIPS) test strip Test twice daily Disp. Brand covered by insurance. Dx e11.9 03/22/18   Caren Macadam, MD  Insulin Glargine (LANTUS SOLOSTAR) 100 UNIT/ML Solostar Pen Inject 70 Units into the skin daily at 10 pm. 03/22/18   Caren Macadam, MD  isosorbide mononitrate (IMDUR) 60 MG 24 hr tablet Take 1 tablet (60 mg total) by mouth daily. 03/22/18   Caren Macadam, MD  levofloxacin (LEVAQUIN) 500 MG tablet Take 1 tablet (500 mg total) by mouth daily. 11/08/18   Rogene Houston, MD  lidocaine (XYLOCAINE) 5 % ointment Apply 1-2 pumps up to four times daily-rub in well 05/19/18   Caren Macadam, MD  linaclotide [facility]) 145 MCG CAPS capsule Take 1 capsule (145 mcg total) by mouth daily before breakfast. 04/19/18   Caren Macadam, MD  metFORMIN (GLUCOPHAGE) 500 MG tablet Take 2 tablets in AM and 1 tablet in PM for diabetes 03/22/18   Caren Macadam, MD  metoprolol tartrate (LOPRESSOR) 25 MG tablet Take 1 tablet (25 mg total) by mouth 2 (two) times daily. 03/22/18   Caren Macadam, MD  Multiple Vitamins-Minerals (ALIVE WOMENS ENERGY) TABS Take 1 tablet by mouth daily.    [provider]  nitroGLYCERIN (NITROSTAT) 0.4 MG SL tablet Place 0.4 mg under the  tongue as directed.    [provider]  ondansetron (ZOFRAN) 4 MG tablet TAKE ONE TABLET BY MOUTH THREE TIMES DAILY AS NEEDED 04/22/18   Caren Macadam, MD  potassium chloride (MICRO-K) 10 MEQ CR capsule TAKE TWO (2) CAPSULES BY MOUTH DAILY. 05/19/18   Caren Macadam, MD  rosuvastatin (CRESTOR) 20 MG tablet Take 1 tablet (20 mg total) by mouth daily. 11/10/17   Raylene Everts, MD  UNABLE TO FIND C-BACL-GABA-IBU-PRIL - 2%-2%-5%-2%  Cream.  Apply 1-2 GM ( 1-2 pumps) up to 4 times daily, and rub in well. 09/24/17   Raylene Everts, MD  UNABLE TO FIND accuchek test strips test 4 times daily, lancets test 4 times  daily. Dx e11.9 10/20/17   Raylene Everts, MD  UNABLE TO FIND Diabetic shoes with inserts based on patients formulary 05/03/18   Caren Macadam, MD    Family History Family History  Problem Relation Age of Onset  . Heart disease Mother   . Arthritis Mother   . Asthma Mother   . Diabetes Mother   . Heart disease Father   . Arthritis Father   . Diabetes Father   . Heart disease Brother   . Pneumonia Brother     Social History Social History   Tobacco Use  . Smoking status: Never Smoker  . Smokeless tobacco: Never Used  Substance Use Topics  . Alcohol use: No    Alcohol/week: 0.0 standard drinks  . Drug use: No     Allergies   Levofloxacin   Review of Systems Review of Systems  All systems reviewed and negative, other than as noted in HPI.  Physical Exam Updated Vital Signs BP (!) 120/59 (BP Location: Left Arm)   Pulse 90   Temp 98.5 F (36.9 C) (Oral)   Resp 19   Ht 5\' 2"  (1.575 m)   Wt 104.3 kg   SpO2 98%   BMI 42.07 kg/m   Physical Exam Vitals signs and nursing note reviewed.  Constitutional:      General: She is not in acute distress.    Appearance: She is well-developed.  HENT:     Head: Normocephalic and atraumatic.  Eyes:     General:        Right eye: No discharge.        Left eye: No discharge.     Conjunctiva/sclera:  Conjunctivae normal.  Neck:     Musculoskeletal: Neck supple.  Cardiovascular:     Rate and Rhythm: Normal rate and regular rhythm.     Heart sounds: Normal heart sounds. No murmur. No friction rub. No gallop.   Pulmonary:     Comments: Tachypnea.  Can only speak in short phrases.  Occasional expiratory wheeze noted. Abdominal:     General: There is no distension.     Palpations: Abdomen is soft.     Tenderness: There is no abdominal tenderness.  Genitourinary:    Comments: Chaperone present. No hemorrhoids or BRB. Melena. Strongly heme positive.  Musculoskeletal:        General: No tenderness.     Right lower leg: Edema present.     Left lower leg: Edema present.     Comments: Mild to moderate symmetric lower extremity edema.  Skin:    General: Skin is warm and dry.  Neurological:     Mental Status: She is alert.  Psychiatric:        Behavior: Behavior normal.        Thought Content: Thought content normal.      ED Treatments / Results  Labs (all labs ordered are listed, but only abnormal results are displayed) Labs Reviewed  CBC WITH DIFFERENTIAL/PLATELET - Abnormal; Notable for the following components:      Result Value   WBC 16.1 (*)    RBC 2.60 (*)    Hemoglobin 7.2 (*)    HCT 24.3 (*)    MCHC 29.6 (*)    RDW 15.6 (*)    nRBC 0.5 (*)    Neutro Abs 13.1 (*)    Abs Immature Granulocytes 0.14 (*)    All other components within normal limits  BASIC METABOLIC PANEL  TROPONIN I  BRAIN NATRIURETIC PEPTIDE  MAGNESIUM  TSH    EKG  EKG Interpretation  Date/Time:  Saturday November 26 2018 12:56:12 EDT Ventricular Rate:  88 PR Interval:    QRS Duration: 98 QT Interval:  439 QTC Calculation: 532 R Axis:   36 Text Interpretation:  Atrial fibrillation Nonspecific T abnormalities, lateral leads Prolonged QT interval Confirmed by Virgel Manifold [phone]) on 11/26/2018 1:10:32 PM       Radiology Dg Chest Portable 1 View  Result Date: 11/26/2018 CLINICAL DATA:   Worsening shortness of breath over the past week. History of COPD. EXAM: PORTABLE CHEST 1 VIEW COMPARISON:  Single-view of the chest 10/19/2016 and 10/08/2015. PA and lateral chest 10/13/2016. FINDINGS: Focal nodular opacity is seen in the right upper lung zone. The lungs otherwise appear clear. No pneumothorax. Blunted appearance of the costophrenic angles is likely due to portable technique and the patient's body habitus. Cardiomegaly is seen. IMPRESSION: Right upper lobe nodular opacity can not be definitively characterized. Recommend CT chest with contrast for further evaluation. Marked cardiomegaly. Electronically Signed   By: Inge Rise M.D.   On: 11/26/2018 13:17    Procedures Procedures (including critical care time)  CRITICAL CARE Performed by: Virgel Manifold Total critical care time: 35 minutes Critical care time was exclusive of separately billable procedures and treating other patients. Critical care was necessary to treat or prevent imminent or life-threatening deterioration. Critical care was time spent personally by me on the following activities: development of treatment plan with patient and/or surrogate as well as nursing, discussions with consultants, evaluation of patient's response to treatment, examination of patient, obtaining history from patient or surrogate, ordering and performing treatments and interventions, ordering and review of laboratory studies, ordering and review of radiographic studies, pulse oximetry and re-evaluation of patient's condition.   Medications Ordered in ED Medications  albuterol (PROVENTIL) (2.5 MG/3ML) 0.083% nebulizer solution 5 mg (has no administration in time range)  albuterol (PROVENTIL,VENTOLIN) solution continuous neb (has no administration in time range)  ipratropium (ATROVENT) nebulizer solution 0.5 mg (has no administration in time range)  furosemide (LASIX) injection 40 mg (has no administration in time range)     Initial  Impression / Assessment and Plan / ED Course  I have reviewed the triage vital signs and the nursing notes.  Pertinent labs & imaging results that were available during my care of the patient were reviewed by me and considered in my medical decision making (see chart for details).  78 year old female with acute on chronic dyspnea.  History of COPD, and only mild wheezing on exam.  She appears to be in A. fib which would be a new diagnosis for her.  Appears to have some volume overload.  Initially ordered some Lasix.  Also try nebs.  Noted to be significantly anemic and change for her. Likely driving symptoms. Will transfuse 1u PRBC. PPI. Medicine/GI consult. Admission.   Final Clinical Impressions(s) / ED Diagnoses   Final diagnoses:  Gastrointestinal hemorrhage, unspecified gastrointestinal hemorrhage type  Symptomatic anemia    ED Discharge Orders    None       Virgel Manifold, MD 11/27/18 [phone]

## 2018-11-26 NOTE — Progress Notes (Signed)
Dr. Su Ley has been notified of the venous blood gas that was obtained  at 1600 with the following results: pH 7.361, pCO2 47.7, pO2 31.00, pO2 sat 42.6, Bicarb of 25.0. The patient was wearing a 3L [place] when the sample was obtained.

## 2018-11-26 NOTE — ED Notes (Signed)
CRITICAL VALUE ALERT  Critical Value:  PO2 <31.0  Date & Time Notied:  11/26/18 5075  Provider Notified: Denton Brick  Orders Received/Actions taken: na

## 2018-11-27 ENCOUNTER — Encounter (HOSPITAL_COMMUNITY): Payer: Self-pay | Admitting: Gastroenterology

## 2018-11-27 DIAGNOSIS — E1121 Type 2 diabetes mellitus with diabetic nephropathy: Secondary | ICD-10-CM

## 2018-11-27 DIAGNOSIS — Z794 Long term (current) use of insulin: Secondary | ICD-10-CM

## 2018-11-27 DIAGNOSIS — J9621 Acute and chronic respiratory failure with hypoxia: Secondary | ICD-10-CM

## 2018-11-27 DIAGNOSIS — I2510 Atherosclerotic heart disease of native coronary artery without angina pectoris: Secondary | ICD-10-CM

## 2018-11-27 DIAGNOSIS — Z953 Presence of xenogenic heart valve: Secondary | ICD-10-CM

## 2018-11-27 DIAGNOSIS — I480 Paroxysmal atrial fibrillation: Secondary | ICD-10-CM | POA: Diagnosis present

## 2018-11-27 DIAGNOSIS — R195 Other fecal abnormalities: Secondary | ICD-10-CM

## 2018-11-27 LAB — CBC
HCT: 23.7 % — ABNORMAL LOW (ref 36.0–46.0)
HCT: 27.1 % — ABNORMAL LOW (ref 36.0–46.0)
Hemoglobin: 7.1 g/dL — ABNORMAL LOW (ref 12.0–15.0)
Hemoglobin: 8.4 g/dL — ABNORMAL LOW (ref 12.0–15.0)
MCH: 28.2 pg (ref 26.0–34.0)
MCH: 29 pg (ref 26.0–34.0)
MCHC: 30 g/dL (ref 30.0–36.0)
MCHC: 31 g/dL (ref 30.0–36.0)
MCV: 94 fL (ref 80.0–100.0)
MCV: 93.4 fL (ref 80.0–100.0)
NRBC: 0.2 % (ref 0.0–0.2)
Platelets: 226 10*3/uL (ref 150–400)
Platelets: 227 10*3/uL (ref 150–400)
RBC: 2.52 MIL/uL — ABNORMAL LOW (ref 3.87–5.11)
RBC: 2.9 MIL/uL — ABNORMAL LOW (ref 3.87–5.11)
RDW: 15.1 % (ref 11.5–15.5)
RDW: 15 % (ref 11.5–15.5)
WBC: 10.3 10*3/uL (ref 4.0–10.5)
WBC: 11.4 10*3/uL — ABNORMAL HIGH (ref 4.0–10.5)
nRBC: 0.2 % (ref 0.0–0.2)

## 2018-11-27 LAB — GLUCOSE, CAPILLARY
GLUCOSE-CAPILLARY: 96 mg/dL (ref 70–99)
GLUCOSE-CAPILLARY: 99 mg/dL (ref 70–99)
GLUCOSE-CAPILLARY: 90 mg/dL (ref 70–99)
Glucose-Capillary: 110 mg/dL — ABNORMAL HIGH (ref 70–99)
Glucose-Capillary: 112 mg/dL — ABNORMAL HIGH (ref 70–99)
Glucose-Capillary: 223 mg/dL — ABNORMAL HIGH (ref 70–99)

## 2018-11-27 LAB — BASIC METABOLIC PANEL
Anion gap: 5 (ref 5–15)
BUN: 45 mg/dL — ABNORMAL HIGH (ref 8–23)
CO2: 26 mmol/L (ref 22–32)
CREATININE: 2.44 mg/dL — AB (ref 0.44–1.00)
Calcium: 8 mg/dL — ABNORMAL LOW (ref 8.9–10.3)
Chloride: 105 mmol/L (ref 98–111)
GFR calc Af Amer: 22 mL/min — ABNORMAL LOW (ref >60)
GFR calc non Af Amer: 19 mL/min — ABNORMAL LOW (ref >60)
Glucose, Bld: 107 mg/dL — ABNORMAL HIGH (ref 70–99)
Potassium: 4.5 mmol/L (ref 3.5–5.1)
Sodium: 136 mmol/L (ref 135–145)

## 2018-11-27 LAB — URINALYSIS, ROUTINE W REFLEX MICROSCOPIC
Bacteria, UA: NONE SEEN
Bilirubin Urine: NEGATIVE
Glucose, UA: NEGATIVE mg/dL
KETONES UR: NEGATIVE mg/dL
Leukocytes,Ua: NEGATIVE
Nitrite: NEGATIVE
Protein, ur: NEGATIVE mg/dL
Specific Gravity, Urine: 1.012 (ref 1.005–1.030)
pH: 5 (ref 5.0–8.0)

## 2018-11-27 LAB — PREPARE RBC (CROSSMATCH)

## 2018-11-27 LAB — LIPASE, BLOOD: Lipase: 25 U/L (ref 11–51)

## 2018-11-27 LAB — MRSA PCR SCREENING: MRSA by PCR: POSITIVE — AB

## 2018-11-27 MED ORDER — INSULIN GLARGINE 100 UNIT/ML ~~LOC~~ SOLN
34.0000 [IU] | Freq: Every day | SUBCUTANEOUS | Status: DC
  Administered 2018-11-28: 34 [IU] via SUBCUTANEOUS
  Filled 2018-11-27 (×2): qty 0.34

## 2018-11-27 MED ORDER — INSULIN GLARGINE 100 UNIT/ML ~~LOC~~ SOLN: 40.0000 [IU] | Freq: Every day | SUBCUTANEOUS | Status: DC

## 2018-11-27 MED ORDER — CHLORHEXIDINE GLUCONATE CLOTH 2 % EX PADS
6.0000 | MEDICATED_PAD | Freq: Every day | CUTANEOUS | Status: DC
  Administered 2018-11-27 – 2018-11-29 (×3): 6 via TOPICAL

## 2018-11-27 MED ORDER — MUPIROCIN 2 % EX OINT
1.0000 "application " | TOPICAL_OINTMENT | Freq: Two times a day (BID) | CUTANEOUS | Status: DC
  Administered 2018-11-27 – 2018-11-29 (×5): 1 via NASAL
  Filled 2018-11-27: qty 22

## 2018-11-27 MED ORDER — ISOSORBIDE MONONITRATE ER 30 MG PO TB24
30.0000 mg | ORAL_TABLET | Freq: Every day | ORAL | Status: DC
  Administered 2018-11-29: 30 mg via ORAL
  Filled 2018-11-27: qty 1

## 2018-11-27 MED ORDER — SODIUM CHLORIDE 0.9 % IV BOLUS
1000.0000 mL | Freq: Once | INTRAVENOUS | Status: AC
  Administered 2018-11-27: 1000 mL via INTRAVENOUS

## 2018-11-27 MED ORDER — METOPROLOL TARTRATE 25 MG PO TABS
12.5000 mg | ORAL_TABLET | Freq: Two times a day (BID) | ORAL | Status: DC
  Administered 2018-11-27 – 2018-11-29 (×5): 12.5 mg via ORAL
  Filled 2018-11-27 (×5): qty 1

## 2018-11-27 MED ORDER — GLUCERNA SHAKE PO LIQD
237.0000 mL | Freq: Three times a day (TID) | ORAL | Status: DC
  Administered 2018-11-27 – 2018-11-28 (×3): 237 mL via ORAL

## 2018-11-27 NOTE — Progress Notes (Signed)
Dr. Denton Brick aware of patient's low blood pressures throughout the shift. Medications for hypertension discontinued and metoprolol dosage decreased to 12.5mg  per MD request. One unit of PRBCs transfused. After transfusion patient showed increased work of breathing. Notified Dr. Denton Brick of patient's WOB and felt as though patient's increased WOB is due to excess fluid. IVF discontinued. Patient's BP too low at this time for diuretics. Suggested arterial line for patient's erratic pressures, but no new orders at this time. Will continue to monitor.

## 2018-11-27 NOTE — Consult Note (Signed)
Referring Provider: No ref. provider found Primary Care Physician:  Bettejane Leavens Mealy, MD Primary Gastroenterologist:  DR. Laural Golden  Reason for Consultation: ANEMIA   Impression: ADMITTED WITH 1-2 WEEKS OF ANOREXIA/LEG/BACK PAIN  LEADING TO DECREASED MOBILITY/PO INTAKE. CAME TO ED BECAUSE SHE WAS UNABLE TO THRIVE AT HOME WORKUP REVEALED Hb DECREASED FROM MAY 2019(11.9) TO MAR 2020(7.1). PT DENIES MELENA OR BRBPR WHILE TAKING ASA 81 MG DAILY AND NO PPI. STOOL WERE  HEME POSITIVE IN ED. DIFFERENTIAL DIAGNOSIS INCLUDES: ANEMIA OF CHRONIC DISEASE(CRI) WITH GASTRIC/SMALL BOWEL AVMs, H PYLORI GASTRITIS, CAMERON'S EROSIONS, ATROPHIC GASTRITIS, CELIAC DISEASE, OR LESS LIKELY EGJ CANCER.  Plan: 1. MAY HAVE FULL LIQUID DIET/GLUCERNA. NPO AFTER MN EXCEPT SIPS WITH MEDS. 2. INSULIN SLIDING SCALE. HOLD LANTUS UNTIL MAR 23 AT BEDTIME. 3. CONTINUE PROTONIX IV BID. 4. TRANSFUSE 1u pRBC TODAY. 5. EGD WITH DR. Laural Golden WITH MODERATE SEDATION ON MAR 23.      HPI:  PT SEEN IN [facility] (DR. Laural Golden) IN NOV 2019 AND MAR 2020 FOR IRON DEFICIENCY ANEMIA. WAS NOTED TO BE ON IRON PO DAILY. PT REPORTS SHE HAS BEEN HAVING TROUBLE WITH LEG AND FOOT PAIN SINCE EARLY MAR 2020. SHE REPORTS NOT BEING ABLE TO WALK. SHE DROPPED AN O2 TANK ON HER LEFT FOOT. AT HER LAST VISIT SHE WAS WAS PLACED ON ABX AND HAD A "HEALING SALVE".  SHE TOOK THE ABX(4 DOSES) AND HAD SWELLING IN HER BACK/LEGS. THEY WERE VERY TIGHT. HER LEGS BEGAN JUMPING. HER HANDS/FEET WERE NUMB LIKE BEES WERE STINGING HER.  SHE THOUGHT ABOUT COMING TO THE ED BUT DIDN'T DUE TO CONCERNS REGARDING CORONAVIRUS. SHE TAKES ASA DAILY BUT NO PPI LISTED ON HER MED LIST.  OVER PAST 2-3 DAYS HER BREATHING BECAME WORSE AND SHE WAS FEELING SO WEAK SHE COULDN'T EAT AND DIDN'T WANT TO EAT. SHE DECIDED TO COME TO THE ED BECAUSE SHE WAS SO WEAK SHE COULDN'T EAT. IN THE ED HER WORKUP REVEALED HER DROPPED TO Hb 7.2 AND HER Cr INCREASED TO 2.57. SHE HAS NAUSEA OFF AND ON. SHE HAD VOMITING x1  ON FRI WHEN SHE TOOK HER PILLS ON AN EMPTY STOMACH(NO BLOOD). SHE HAD DIARRHEA x1 SAT AM AND IT WAS "GREEN LIKE GRASS". SHE HAS TROUBLE WITH CONSTIPATION AND THE LINZESS INITIALLY WORKED BUT NOW SHE FEELS LIKE IT IS LESS EFECTIVE. SHE OCCASIONALLY HAS TROUBLE SWALLOWING PILLS AND BREAD. NO TROUBLE WITH LIQUIDS. SHE FREQUENTLY HAS L CHEST PAIN.   PT DENIES FEVER, CHILLS, HEMATOCHEZIA, HEMATEMESIS, melena, abdominal pain, problems OR heartburn or indigestion. 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 NO EGD ON RECORD.    Past Medical History:  Diagnosis Date  . Allergy   . Anemia   . Arthritis   . Asthma   . Cataract   . CHF (congestive heart failure) ([place])   . CKD (chronic kidney disease) stage 3, GFR 30-59 ml/min (H[facility]) 12/11/2016  . COPD (chronic obstructive pulmonary disease) ([place])   . Diabetes ([place])    x 30 yrs  . Emphysema of lung ([place])   . Essential hypertension 09/21/2011  . GERD (gastroesophageal reflux disease)   . Heart disease   . High cholesterol   . Oxygen deficiency     Past Surgical History:  Procedure Laterality Date  . AORTIC VALVE REPLACEMENT    . APPENDECTOMY    . CHOLECYSTECTOMY    . COLONOSCOPY N/A 03/29/2015   Procedure: COLONOSCOPY;  Surgeon: Rogene Houston, MD;  [facility];  Service: Endoscopy;  Laterality: N/A;  1015 -  moved to 1:10 - Ann to notify  . COLONOSCOPY N/A 06/24/2016   Procedure: COLONOSCOPY;  Surgeon: Rogene Houston, MD;  [facility];  Service: Endoscopy;  Laterality: N/A;  245 - moved to 10/18 @ 1:00 - Ann to notify pt  . CORONARY ARTERY BYPASS GRAFT  1993  . ESOPHAGEAL DILATION N/A 03/29/2015   Procedure: ESOPHAGEAL DILATION;  Surgeon: Rogene Houston, MD;  [facility];  Service: Endoscopy;  Laterality: N/A;  . ESOPHAGOGASTRODUODENOSCOPY N/A 03/29/2015   Procedure: ESOPHAGOGASTRODUODENOSCOPY (EGD);  Surgeon: Rogene Houston, MD;  [facility];  Service: Endoscopy;  Laterality: N/A;  . GIVENS  CAPSULE STUDY N/A 12/25/2015   Procedure: GIVENS CAPSULE STUDY;  Surgeon: Rogene Houston, MD;  [facility];  Service: Endoscopy;  Laterality: N/A;    Prior to Admission medications   Medication Sig Start Date End Date Taking? Authorizing Provider  albuterol (PROVENTIL HFA;VENTOLIN HFA) 108 (90 Base) MCG/ACT inhaler Inhale 1-2 puffs into the lungs every 4 (four) hours as needed for wheezing or shortness of breath. 03/22/18  Yes Hagler, Apolonio Schneiders, MD  albuterol (PROVENTIL) (2.5 MG/3ML) 0.083% nebulizer solution INHALE CONTENTS OF 1 VIAL BY NEBULIZER EVERY 4 HOURS AS NEEDED 03/22/18  Yes Hagler, Apolonio Schneiders, MD  amitriptyline (ELAVIL) 25 MG tablet Take 25 mg by mouth at bedtime as needed (pain).   Yes [provider]  budesonide-formoterol (SYMBICORT) 80-4.5 MCG/ACT inhaler Inhale 2 puffs into the lungs 2 (two) times daily. 03/22/18  Yes Hagler, Apolonio Schneiders, MD  CARTIA XT 180 MG 24 hr capsule Take 1 capsule (180 mg total) by mouth daily. 03/22/18  Yes Hagler, Apolonio Schneiders, MD  Diclofenac Sodium 3 % GEL Apply 1 application topically 4 (four) times daily as needed. For pain in legs/back. 03/22/18  Yes Hagler, Apolonio Schneiders, MD  docusate sodium (COLACE) 100 MG capsule Take 1 capsule (100 mg total) by mouth daily. 04/19/18  Yes Caren Macadam, MD  exenatide (BYETTA) 10 MCG/0.04ML SOPN injection Inject 0.04 mLs (10 mcg total) into the skin 2 (two) times daily with a meal. 03/22/18  Yes Hagler, Apolonio Schneiders, MD  FERREX 150 150 MG capsule Take 1 capsule (150 mg total) by mouth daily. 03/22/18  Yes Caren Macadam, MD  furosemide (LASIX) 40 MG tablet TAKE ONE TABLET BY MOUTH TWICE DAILY. Patient taking differently: 40 mg daily.  04/11/18  Yes Hagler, Apolonio Schneiders, MD  glucose blood (IGLUCOSE TEST STRIPS) test strip Test twice daily Disp. Brand covered by insurance. Dx e11.9 03/22/18  Yes Caren Macadam, MD  Insulin Glargine (LANTUS SOLOSTAR) 100 UNIT/ML Solostar Pen Inject 70 Units into the skin daily at 10 pm. 03/22/18  Yes Hagler,  Apolonio Schneiders, MD  lidocaine (XYLOCAINE) 5 % ointment Apply 1-2 pumps up to four times daily-rub in well 05/19/18  Yes Hagler, Apolonio Schneiders, MD  linaclotide [facility]) 145 MCG CAPS capsule Take 1 capsule (145 mcg total) by mouth daily before breakfast. 04/19/18  Yes Caren Macadam, MD  metFORMIN (GLUCOPHAGE) 500 MG tablet Take 2 tablets in AM and 1 tablet in PM for diabetes 03/22/18  Yes Hagler, Apolonio Schneiders, MD  metolazone (ZAROXOLYN) 2.5 MG tablet Take 1 tablet by mouth daily. 09/15/18  Yes [provider]  metoprolol tartrate (LOPRESSOR) 25 MG tablet Take 1 tablet (25 mg total) by mouth 2 (two) times daily. 03/22/18  Yes Caren Macadam, MD  Multiple Vitamins-Minerals (ALIVE WOMENS ENERGY) TABS Take 1 tablet by mouth daily.   Yes [provider]  nitroGLYCERIN (NITROSTAT) 0.4 MG SL tablet Place 0.4  mg under the tongue as directed.   Yes [provider]  ondansetron (ZOFRAN) 4 MG tablet TAKE ONE TABLET BY MOUTH THREE TIMES DAILY AS NEEDED 04/22/18  Yes Hagler, Apolonio Schneiders, MD  potassium chloride (MICRO-K) 10 MEQ CR capsule TAKE TWO (2) CAPSULES BY MOUTH DAILY. 05/19/18  Yes Hagler, Apolonio Schneiders, MD  rosuvastatin (CRESTOR) 20 MG tablet Take 1 tablet (20 mg total) by mouth daily. 11/10/17  Yes Raylene Everts, MD  sulfamethoxazole-trimethoprim (BACTRIM DS,SEPTRA DS) 800-160 MG tablet Take 1 tablet by mouth 2 (two) times daily. Take one tablet by mouth twice daily for 7 days 11/22/18  Yes [provider]  traMADol (ULTRAM) 50 MG tablet Take 50 mg by mouth every 12 (twelve) hours as needed.   Yes [provider]  UNABLE TO FIND C-BACL-GABA-IBU-PRIL - 2%-2%-5%-2%  Cream.  Apply 1-2 GM ( 1-2 pumps) up to 4 times daily, and rub in well. 09/24/17  Yes Raylene Everts, MD  UNABLE TO FIND accuchek test strips test 4 times daily, lancets test 4 times daily. Dx e11.9 10/20/17  Yes Raylene Everts, MD  UNABLE TO FIND Diabetic shoes with inserts based on patients formulary 05/03/18  Yes Caren Macadam,  MD  aspirin EC 81 MG tablet Take 1 tablet (81 mg total) by mouth daily. 06/28/16   Rogene Houston, MD  Cholecalciferol (VITAMIN D3) 5000 units TABS Take 5,000 Units by mouth daily.    [provider]  isosorbide mononitrate (IMDUR) 60 MG 24 hr tablet Take 1 tablet (60 mg total) by mouth daily. 03/22/18   Caren Macadam, MD  levofloxacin (LEVAQUIN) 500 MG tablet Take 1 tablet (500 mg total) by mouth daily. Patient not taking: Reported on 11/26/2018 11/08/18   Rogene Houston, MD  lisinopril (PRINIVIL,ZESTRIL) 2.5 MG tablet Take 1 tablet by mouth daily. Take 1 tablet by mouth daily for kidney protection 10/05/18   [provider]    Current Facility-Administered Medications  Medication Dose Route Frequency Provider Last Rate Last Dose  . 0.9 %  sodium chloride infusion  10 mL/hr Intravenous Once Emokpae, Courage, MD      . 0.9 %  sodium chloride infusion   Intravenous Continuous Emokpae, Courage, MD 50 mL/hr at 11/27/18 0700    . 0.9 %  sodium chloride infusion  250 mL Intravenous PRN Emokpae, Courage, MD      . acetaminophen (TYLENOL) tablet 650 mg  650 mg Oral Q6H PRN Emokpae, Courage, MD       Or  . acetaminophen (TYLENOL) suppository 650 mg  650 mg Rectal Q6H PRN Emokpae, Courage, MD      . albuterol (PROVENTIL) (2.5 MG/3ML) 0.083% nebulizer solution 2.5 mg  2.5 mg Nebulization Q2H PRN Emokpae, Courage, MD      . albuterol (PROVENTIL,VENTOLIN) solution continuous neb  10 mg/hr Nebulization Continuous Emokpae, Courage, MD 2 mL/hr at 11/26/18 1335 10 mg/hr at 11/26/18 1335  . amitriptyline (ELAVIL) tablet 25 mg  25 mg Oral QHS PRN Emokpae, Courage, MD      . Chlorhexidine Gluconate Cloth 2 % PADS 6 each  6 each Topical [id number] Shela Leff, MD   6 each at 11/27/18 0515  . diltiazem (CARDIZEM CD) 24 hr capsule 180 mg  180 mg Oral Daily Emokpae, Courage, MD      . furosemide (LASIX) injection 40 mg  40 mg Intravenous Once Emokpae, Courage, MD      . insulin aspart (novoLOG)  injection 0-9 Units  0-9 Units Subcutaneous Q4H Bodenheimer,  Charles A, NP      . insulin glargine (LANTUS) injection 40 Units  40 Units Subcutaneous QHS Bodenheimer, Charles A, NP   40 Units at 11/26/18 2319  . isosorbide mononitrate (IMDUR) 24 hr tablet 60 mg  60 mg Oral Daily Emokpae, Courage, MD      . lidocaine (XYLOCAINE) 5 % ointment   Topical TID PRN Emokpae, Courage, MD      . metoprolol tartrate (LOPRESSOR) tablet 25 mg  25 mg Oral BID Denton Brick, Courage, MD   25 mg at 11/26/18 2317  . mupirocin ointment (BACTROBAN) 2 % 1 application  1 application Nasal BID Shela Leff, MD   1 application at 16/10/96 0950  . ondansetron (ZOFRAN) tablet 4 mg  4 mg Oral Q6H PRN Emokpae, Courage, MD       Or  . ondansetron (ZOFRAN) injection 4 mg  4 mg Intravenous Q6H PRN Denton Brick, Courage, MD   4 mg at 11/26/18 1746  . oxyCODONE (Oxy IR/ROXICODONE) immediate release tablet 5 mg  5 mg Oral Q6H PRN Emokpae, Courage, MD      . pantoprazole (PROTONIX) injection 40 mg  40 mg Intravenous Q12H Emokpae, Courage, MD   40 mg at 11/27/18 0950  . polyethylene glycol (MIRALAX / GLYCOLAX) packet 17 g  17 g Oral Daily PRN Emokpae, Courage, MD      . rosuvastatin (CRESTOR) tablet 20 mg  20 mg Oral Daily Emokpae, Courage, MD      . sodium chloride flush (NS) 0.9 % injection 3 mL  3 mL Intravenous Q12H Emokpae, Courage, MD   3 mL at 11/27/18 0950  . sodium chloride flush (NS) 0.9 % injection 3 mL  3 mL Intravenous PRN Emokpae, Courage, MD      . traMADol (ULTRAM) tablet 50 mg  50 mg Oral Q12H PRN Roxan Hockey, MD   50 mg at 11/26/18 2303  . traZODone (DESYREL) tablet 50 mg  50 mg Oral QHS PRN Roxan Hockey, MD   50 mg at 11/26/18 2303    Allergies as of 11/26/2018 - Review Complete 11/26/2018  Allergen Reaction Noted  . Levofloxacin Swelling 11/14/2018    Family History  Problem Relation Age of Onset  . Heart disease Mother   . Arthritis Mother   . Asthma Mother   . Diabetes Mother   . Heart disease  Father   . Arthritis Father   . Diabetes Father   . Heart disease Brother   . Pneumonia Brother    Social History   Socioeconomic History  . Marital status: Single    Spouse name: Not on file  . Number of children: 0  . Years of education: 10  . Highest education level: Not on file  Occupational History  . Occupation: retired  Scientific laboratory technician  . Financial resource strain: Not hard at all  . Food insecurity:    Worry: Never true    Inability: Never true  . Transportation needs:    Medical: No    Non-medical: No  Tobacco Use  . Smoking status: Never Smoker  . Smokeless tobacco: Never Used  Substance and Sexual Activity  . Alcohol use: No    Alcohol/week: 0.0 standard drinks  . Drug use: No  . Sexual activity: Never  Lifestyle  . Physical activity:    Days per week: 0 days    Minutes per session: 0 min  . Stress: Not at all  Relationships  . Social connections:    Talks on phone: More  than three times a week    Gets together: More than three times a week    Attends religious service: More than 4 times per year    Active member of club or organization: No    Attends meetings of clubs or organizations: Never    Relationship status: Never married  Other Topics Concern  . Not on file  Social History Narrative   Lives in own home. 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   WIDOWED: NO KIDS OR FUR BABIES.   HER SISTER( JOSEPHINE WEEKS) AND HER NIECES AND NEPHEWS ASSIST WITH HER CARE.   Often nieces or nephews come to stay        Review of Systems: PER HPI OTHERWISE ALL SYSTEMS ARE NEGATIVE.   Vitals: Blood pressure (!) 94/43, pulse 79, temperature 98.9 F (37.2 C), temperature source Oral, resp. rate (!) 25, height 5\' 2"  (1.575 m), weight 112.3 kg, SpO2 99 %.  Physical Exam: General:   Alert,  Well-developed, well-nourished, pleasant and cooperative in NAD Head:  Normocephalic and atraumatic. Eyes:  Sclera clear, no icterus.    Mouth:   No lesions, dentition ABnormal. Neck:  Supple; no masses. Lungs:  Clear throughout to auscultation.   No wheezes. No acute distress. Heart:  Regular rate and rhythm; no murmurs. Abdomen:  Soft, nontender and nondistended. No masses noted. Normal bowel sounds, without guarding, and without rebound.   Msk:  Symmetrical with gross deformities.  Extremities:  Without edema. Neurologic:  Alert and  oriented x4;  NO  NEW FOCAL DEFICITS Cervical Nodes:  No significant cervical adenopathy. Psych:  Alert and cooperative. Normal mood and affect.   Lab Results: Recent Labs    11/26/18 1302 11/26/18 2135 11/27/18 0437  WBC 16.1* 11.2* 10.3  HGB 7.2* 7.4* 7.1*  HCT 24.3* 24.1* 23.7*  PLT 289 230 226   BMET Recent Labs    11/26/18 1302 11/27/18 0437  NA 134* 136  K 4.6 4.5  CL 100 105  CO2 22 26  GLUCOSE 180* 107*  BUN 50* 45*  CREATININE 2.57* 2.44*  CALCIUM 8.7* 8.0*    Studies/Results:pCXR: CARDIOMEGALY/LUNGS CLEAR   LOS: 1 day   Jameson Morrow  11/27/2018, 9:54 AM

## 2018-11-27 NOTE — Progress Notes (Signed)
Notified Hospitalist for reduced BP, Order received and in progress.

## 2018-11-27 NOTE — Progress Notes (Signed)
Portable bedside monitor is being used in conjunction with room setup.  BP with portable system are in range, Will continue to use the portable monitor(dinamap) to check against the in room setup. These BPs will be entered manually

## 2018-11-27 NOTE — Progress Notes (Signed)
Patient Demographics:    Rachel Suarez, is a 78 y.o. female, DOB - 1941/07/29, ZYY:[id number]  Admit date - 11/26/2018   Admitting Physician Allianna Beaubien Denton Brick, MD  Outpatient Primary MD for the patient is [name], MD  LOS - 1   Chief Complaint  Patient presents with  . 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 female with past medical history relevant for CAD with previous PCI to LAD, CABG to LAD 1993, CKD stage III-IV (baseline creatinine previously around 1.3 but recently as high as 2.8 on 10/11/2018), status post TAVR February 2017, HFpEF with EF of 60 to 65% per echo from 10/04/2018, chronic anemia of multifactorial etiology including CKD requiring transfusion from time to time, well as history of diabetes mellitus, hypertension and chronic hypoxic respiratory failure secondary to COPD/emphysema admitted on 11/26/2018 with acute on chronic Anemia secondary to presumed GI bleed as well as acute on chronic hypoxic respiratory failure requiring BiPAP with increased work of breathing secondary to acute blood loss anemia   Plan:- 1)Acute on chronic Anemia secondary to Acute GI bleed----somewhat acute blood loss anemia superimposed on her chronic anemia  hemoglobin is down to 7.1 from 7.4  (admission hemoglobin was 7.2, patient received 1 unit of packed cells, hemoglobin came up to 7.4, however patient received IV fluids due to hemodynamic concerns, repeat hemoglobin post IV fluids 7.1,  baseline which is usually between 11.5 and 12..... Hemoccult positive on admission, no further melena overnight..... GI consult pending,, keep n.p.o. except for meds and ice chips, continue Protonix 40 IV twice daily , will transfuse additional unit of backslash x1, check serial H&H and transfuse as indicated, given history of CAD we will try to keep hemoglobin close to 9  2) acute on chronic hypoxic respiratory failure in a patient with underlying COPD/emphysema--- baseline patient uses 3 L of oxygen at home--- admission patient had increased work of breathing and worsening hypoxia requiring BiPAP tonight.,  Weaned off BiPAP already as of 11/27/2018, back on nasal cannula at 3 L/min  ... Chest x-ray and clinical exam does not reflect significant volume overload or pneumonia--- patient has chronic lower extremity edema, per patient unchanged from baseline  3)AKI----acute kidney injury on CKD stage III-IV or/Versus progression of CKD --   creatinine on admission= 2.57 ,   baseline creatinine = 1.3 (but it was 2.8 on 10/11/2018 at [facility])   , creatinine is now= 2.44, continue to  hold Lasix, apparently recently on Bactrim DS, hold metolazone, hold lisinopril, hold metformin, renally adjust medications, avoid nephrotoxic agents/dehydration/hypotension---  4)DM2--last A1c 7.2, reflecting fair diabetic control, patient will be n.p.o. due to GI bleed, Allow some permissive Hyperglycemia rather than risk life-threatening hypoglycemia in a patient with unreliable oral intake. Use Novolog/Humalog Sliding scale insulin with Accu-Cheks/Fingersticks as ordered---  we will reduce Lantus insulin   to 34 units from 70 units every hs until oral intake is more reliable,   continue to hold metformin due to kidney concerns, hold [place]  5)CAD--- patient with history of CAD status post previous PCI and CABG of LAD in 1993--- hold aspirin due to GI bleed, no ACS symptoms at this time, blood pressure has been soft, patient had transient hypotension overnight requiring IV fluids, transfusion of packed cells should help with hemodynamic issues, decrease  metoprolol to 12.5 mg twice daily, hold isosorbide for now, and restart at 30 mg on 11/28/2018,  continue Crestor 20 mg daily, given history of CAD we will try to keep hemoglobin close to 9   6)H/o Aortic Stenosis--- status post previous TAVR in February 2017--- echo from 10/04/2018 with normal functioning aortic valve, EF 60 to 65%  7) leukocytosis--- hold off on further antibiotics at this time, BBC is down to 10.3 from 16.1.Marland KitchenMarland Kitchen Patient may have had reactive leukocytosis on admission due to acute GI bleed, awaiting blood and urine cultures  8)right upper lobe nodular opacity ----- which will  require further characterization with CT with contrast which we cannot do now due to kidney function  9)PAFib--- patient appears to have paroxysmal atrial fibrillation... CHA2DS2- VASc score   is = 7     Which is  equal to = 11.2  % annual risk of stroke , not a candidate for anticoagulation due to acute GI bleed as well as history of chronic blood loss/anemia requiring transfusion from time to time--- Cardizem has been discontinued due to soft blood pressures, metoprolol has been reduced to 12.5 mg twice daily for CAD and for rate control  Disposition/Need for in-Hospital Stay- patient unable to be discharged at this time due to acute GI bleed requiring transfusion with soft hemodynamics at this time... Worsening respiratory status requiring BiPAP use  Code Status : Full  Family Communication:   na   Disposition Plan  : TBD  Consults  :  Gi  DVT Prophylaxis  :    - SCDs (Gi bleed)  Lab Results  Component Value Date   PLT  226 11/27/2018    Inpatient Medications  Scheduled Meds: . Chlorhexidine Gluconate Cloth  6 each Topical Q0600  . diltiazem  180 mg Oral Daily  . furosemide  40 mg Intravenous Once  . insulin aspart  0-9 Units Subcutaneous Q4H  . [START ON 11/28/2018] insulin glargine  40 Units Subcutaneous QHS  . isosorbide mononitrate  60 mg Oral Daily  . metoprolol tartrate  25 mg Oral BID  . mupirocin ointment  1 application Nasal BID  . pantoprazole (PROTONIX) IV  40 mg Intravenous Q12H  . rosuvastatin  20 mg Oral Daily  . sodium chloride flush  3 mL Intravenous Q12H   Continuous Infusions: . sodium chloride    . sodium chloride 50 mL/hr at 11/27/18 0700  . sodium chloride    . albuterol 10 mg/hr (11/26/18 1335)   PRN Meds:.sodium chloride, acetaminophen **OR** acetaminophen, albuterol, amitriptyline, lidocaine, ondansetron **OR** ondansetron (ZOFRAN) IV, oxyCODONE, polyethylene glycol, sodium chloride flush, traMADol, traZODone    Anti-infectives (From admission, onward)   Start     Dose/Rate Route Frequency Ordered Stop   11/26/18 1445  cefTRIAXone (ROCEPHIN) 1 g in sodium chloride 0.9 % 100 mL IVPB     1 g 200 mL/hr over 30 Minutes Intravenous  Once 11/26/18 1438 11/26/18 1524   11/26/18 1445  azithromycin (ZITHROMAX) tablet 500 mg     500 mg Oral  Once 11/26/18 1438 11/26/18 1458        Objective:   Vitals:   11/27/18 0600 11/27/18 0700 11/27/18 0800 11/27/18 0900  BP: (!) 97/45 (!) 103/48  (!) 94/43  Pulse: 66 75 72 79  Resp: (!) 26 (!) 25 19 (!) 25  Temp:  98.9 F (37.2 C)    TempSrc:  Oral    SpO2: 97% 97% 100% 99%  Weight:      Height:        Wt Readings from Last 3 Encounters:  11/27/18 112.3 kg  11/08/18 110.5 kg  05/03/18 106.6 kg     Intake/Output Summary (Last 24 hours) at 11/27/2018 1044 Last data filed at 11/27/2018 0700 Gross per 24 hour  Intake 781.53 ml  Output 400 ml  Net 381.53 ml     Physical Exam Patient is examined daily including today on  11/27/18 , exams remain the same as of yesterday except that has changed   Physical Examination: General appearance - alert,  able to speak in short sentences Nose-  [place] 3 L/min mental status - alert, oriented to person, place, and time,  Eyes - sclera anicteric Neck - supple, no JVD elevation , Chest -diminished in bases, no wheezing or rales  heart - S1 and S2 normal, irregularly irregular,  CABG Abdomen - soft, nontender, nondistended, no masses or organomegaly Neurological - screening mental status exam normal, neck supple without rigidity, cranial nerves II through XII intact, DTR's normal and symmetric Extremities -1 + pitting edema pedal edema noted, intact peripheral pulses  Skin - warm, dry   Data Review:   Micro Results Recent Results (from the past 240 hour(s))  MRSA PCR Screening     Status: Abnormal   Collection Time: 11/26/18  6:25 PM  Result Value Ref Range Status   MRSA by PCR POSITIVE (A) NEGATIVE Final    Comment:        The GeneXpert MRSA Assay (FDA approved for NASAL specimens only), is one component of a comprehensive MRSA colonization surveillance program. It is not intended to diagnose MRSA infection nor to guide or monitor treatment for MRSA infections. RESULT CALLED TO, READ BACK BY AND VERIFIED WITH: DANIELS,J @ 1610 ON 11/27/18 BY JUW Performed at [facility], [address], [place], [place] [address]   Culture, blood (Routine X 2) w Reflex to ID Panel     Status: None (Preliminary result)   Collection Time: 11/26/18  9:36 PM  Result Value Ref Range Status   Specimen Description RIGHT ANTECUBITAL  Final   Special Requests   Final    BOTTLES DRAWN AEROBIC AND ANAEROBIC Blood Culture adequate volume   Culture   Final    NO GROWTH < 12 HOURS Performed at [facility], [address], [place], [place] [address]    Report Status PENDING  Incomplete    Radiology Reports Dg Chest Portable 1 View  Result Date: 11/26/2018 CLINICAL DATA:  Worsening  shortness of breath over the past week. History of COPD. EXAM: PORTABLE CHEST 1 VIEW COMPARISON:  Single-view of the chest 10/19/2016 and 10/08/2015. PA and lateral chest 10/13/2016. FINDINGS: Focal nodular opacity is seen in the right upper lung zone. The lungs otherwise  appear clear. No pneumothorax. Blunted appearance of the costophrenic angles is likely due to portable technique and the patient's body habitus. Cardiomegaly is seen. IMPRESSION: Right upper lobe nodular opacity can not be definitively characterized. Recommend CT chest with contrast for further evaluation. Marked cardiomegaly. Electronically Signed   By: Inge Rise M.D.   On: 11/26/2018 13:17     CBC Recent Labs  Lab 11/26/18 1302 11/26/18 2135 11/27/18 0437  WBC 16.1* 11.2* 10.3  HGB 7.2* 7.4* 7.1*  HCT 24.3* 24.1* 23.7*  PLT 289 230 226  MCV 93.5 92.3 94.0  MCH 27.7 28.4 28.2  MCHC 29.6* 30.7 30.0  RDW 15.6* 15.1 15.1  LYMPHSABS 1.7  --   --   MONOABS 1.0  --   --   EOSABS 0.0  --   --   BASOSABS 0.1  --   --     Chemistries  Recent Labs  Lab 11/26/18 1302 11/27/18 0437  NA 134* 136  K 4.6 4.5  CL 100 105  CO2 22 26  GLUCOSE 180* 107*  BUN 50* 45*  CREATININE 2.57* 2.44*  CALCIUM 8.7* 8.0*  MG 2.5*  --    ------------------------------------------------------------------------------------------------------------------ No results for input(s): CHOL, HDL, LDLCALC, TRIG, CHOLHDL, LDLDIRECT in the last 72 hours.  Lab Results  Component Value Date   HGBA1C 7.2 (H) 01/20/2018   ------------------------------------------------------------------------------------------------------------------ Recent Labs    11/26/18 1302  TSH 1.680   ------------------------------------------------------------------------------------------------------------------ No results for input(s): VITAMINB12, FOLATE, FERRITIN, TIBC, IRON, RETICCTPCT in the last 72 hours.  Coagulation profile No results for input(s): INR,  PROTIME in the last 168 hours.  No results for input(s): DDIMER in the last 72 hours.  Cardiac Enzymes Recent Labs  Lab 11/26/18 1302  TROPONINI <0.03   ------------------------------------------------------------------------------------------------------------------    Component Value Date/Time   BNP 347.0 (H) 11/26/2018 1302     Roxan Hockey M.D on 11/27/2018 at 10:44 AM  Go to [email or web address] - for contact info  [facility] - Office  [phone]

## 2018-11-27 NOTE — Progress Notes (Signed)
Obtained blood pressure with dinamap obtained reading in a satisfactory range.(0500)   Will reassess with dinamap for future BP readings.

## 2018-11-28 ENCOUNTER — Encounter (HOSPITAL_COMMUNITY)
Admission: EM | Disposition: A | Discharge status: Home Health | Payer: Self-pay | Source: Home / Self Care | Attending: Family Medicine

## 2018-11-28 ENCOUNTER — Encounter (HOSPITAL_COMMUNITY): Payer: Self-pay | Admitting: *Deleted

## 2018-11-28 DIAGNOSIS — D62 Acute posthemorrhagic anemia: Secondary | ICD-10-CM

## 2018-11-28 DIAGNOSIS — K922 Gastrointestinal hemorrhage, unspecified: Secondary | ICD-10-CM

## 2018-11-28 DIAGNOSIS — K31819 Angiodysplasia of stomach and duodenum without bleeding: Secondary | ICD-10-CM

## 2018-11-28 DIAGNOSIS — K228 Other specified diseases of esophagus: Secondary | ICD-10-CM

## 2018-11-28 HISTORY — PX: ESOPHAGOGASTRODUODENOSCOPY: SHX5428

## 2018-11-28 LAB — GLUCOSE, CAPILLARY
GLUCOSE-CAPILLARY: 136 mg/dL — AB (ref 70–99)
GLUCOSE-CAPILLARY: 117 mg/dL — AB (ref 70–99)
Glucose-Capillary: 100 mg/dL — ABNORMAL HIGH (ref 70–99)
Glucose-Capillary: 107 mg/dL — ABNORMAL HIGH (ref 70–99)
Glucose-Capillary: 117 mg/dL — ABNORMAL HIGH (ref 70–99)
Glucose-Capillary: 202 mg/dL — ABNORMAL HIGH (ref 70–99)

## 2018-11-28 LAB — BPAM RBC
Blood Product Expiration Date: 202004092359
Blood Product Expiration Date: 202004202359
ISSUE DATE / TIME: 202003211555
ISSUE DATE / TIME: 202003221125
Unit Type and Rh: 9500
Unit Type and Rh: 9500

## 2018-11-28 LAB — CBC
HCT: 28.9 % — ABNORMAL LOW (ref 36.0–46.0)
Hemoglobin: 8.6 g/dL — ABNORMAL LOW (ref 12.0–15.0)
MCH: 28 pg (ref 26.0–34.0)
MCHC: 29.8 g/dL — ABNORMAL LOW (ref 30.0–36.0)
MCV: 94.1 fL (ref 80.0–100.0)
Platelets: 248 10*3/uL (ref 150–400)
RBC: 3.07 MIL/uL — ABNORMAL LOW (ref 3.87–5.11)
RDW: 15.2 % (ref 11.5–15.5)
WBC: 12.2 10*3/uL — ABNORMAL HIGH (ref 4.0–10.5)
nRBC: 0.2 % (ref 0.0–0.2)

## 2018-11-28 LAB — TYPE AND SCREEN
ABO/RH(D): O NEG
Antibody Screen: NEGATIVE
Unit division: 0
Unit division: 0

## 2018-11-28 LAB — BASIC METABOLIC PANEL
Anion gap: 7 (ref 5–15)
BUN: 44 mg/dL — ABNORMAL HIGH (ref 8–23)
CO2: 24 mmol/L (ref 22–32)
Calcium: 8.6 mg/dL — ABNORMAL LOW (ref 8.9–10.3)
Chloride: 106 mmol/L (ref 98–111)
Creatinine, Ser: 2.18 mg/dL — ABNORMAL HIGH (ref 0.44–1.00)
GFR calc non Af Amer: 21 mL/min — ABNORMAL LOW (ref >60)
GFR, EST AFRICAN AMERICAN: 25 mL/min — AB (ref >60)
Glucose, Bld: 136 mg/dL — ABNORMAL HIGH (ref 70–99)
Potassium: 5.1 mmol/L (ref 3.5–5.1)
Sodium: 137 mmol/L (ref 135–145)

## 2018-11-28 LAB — URINE CULTURE
Culture: NO GROWTH
Special Requests: NORMAL

## 2018-11-28 SURGERY — EGD (ESOPHAGOGASTRODUODENOSCOPY)
Anesthesia: Moderate Sedation

## 2018-11-28 MED ORDER — ASPIRIN 81 MG PO CHEW: 81.0000 mg | CHEWABLE_TABLET | Freq: Every day | ORAL | Status: DC

## 2018-11-28 MED ORDER — MEPERIDINE HCL 50 MG/ML IJ SOLN
INTRAMUSCULAR | Status: AC
  Filled 2018-11-28: qty 1

## 2018-11-28 MED ORDER — MIDAZOLAM HCL 5 MG/5ML IJ SOLN
INTRAMUSCULAR | Status: AC
  Filled 2018-11-28: qty 10

## 2018-11-28 MED ORDER — LIDOCAINE VISCOUS HCL 2 % MT SOLN
OROMUCOSAL | Status: AC
  Filled 2018-11-28: qty 15

## 2018-11-28 MED ORDER — LIDOCAINE VISCOUS HCL 2 % MT SOLN
OROMUCOSAL | Status: DC | PRN
  Administered 2018-11-28: 1 via OROMUCOSAL

## 2018-11-28 MED ORDER — MIDAZOLAM HCL 5 MG/5ML IJ SOLN
INTRAMUSCULAR | Status: DC | PRN
  Administered 2018-11-28: 2 mg via INTRAVENOUS

## 2018-11-28 MED ORDER — OXYCODONE HCL 5 MG PO TABS: 5.0000 mg | ORAL_TABLET | Freq: Four times a day (QID) | ORAL | Status: DC | PRN

## 2018-11-28 MED ORDER — INSULIN ASPART 100 UNIT/ML ~~LOC~~ SOLN
SUBCUTANEOUS | Status: AC
  Filled 2018-11-28: qty 1

## 2018-11-28 MED ORDER — MEPERIDINE HCL 50 MG/ML IJ SOLN
INTRAMUSCULAR | Status: DC | PRN
  Administered 2018-11-28: 25 mg

## 2018-11-28 MED ORDER — NYSTATIN 100000 UNIT/GM EX POWD
Freq: Two times a day (BID) | CUTANEOUS | Status: DC
  Administered 2018-11-28 – 2018-11-29 (×2): via TOPICAL
  Filled 2018-11-28: qty 15

## 2018-11-28 NOTE — Progress Notes (Signed)
Patient Demographics:    Rachel Suarez, is a 78 y.o. female, DOB - 1940/12/29, YFV:[id number]  Admit date - 11/26/2018   Admitting Physician [name], MD  Outpatient Primary MD for the patient is [name], MD  LOS - 2   Chief Complaint  Patient presents with  . 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 female with past medical history relevant for CAD with previous PCI to LAD, CABG to LAD 1993, CKD stage III-IV (baseline creatinine previously around 1.3 but recently as high as 2.8 on 10/11/2018), status post TAVR February 2017, HFpEF with EF of 60 to 65% per echo from 10/04/2018, chronic anemia of multifactorial etiology including CKD requiring transfusion from time to time, well as history of diabetes mellitus, hypertension and chronic hypoxic respiratory failure secondary to COPD/emphysema admitted on 11/26/2018 with acute on chronic Anemia secondary to presumed GI bleed as well as acute on chronic hypoxic respiratory failure requiring BiPAP with increased work of breathing secondary to acute blood loss anemia   Plan:- 1)Acute on chronic Anemia secondary to Acute GI bleed----somewhat acute blood loss anemia superimposed on her chronic anemia  hemoglobin is down to 7.1 from 7.4  (admission hemoglobin was 7.2, patient received 1 unit of packed cells, hemoglobin stable at 8.6 after 2 units of packed cells,   Baseline hgb is usually between 11.5 and 12..... Hemoccult positive on admission, no further melena overnight..... 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 Source of bleeding not identified.,,  If further drop in H&H consider RBC tagged scan, given history of CAD we will try to keep hemoglobin close to 9  2) acute on chronic hypoxic respiratory failure in a patient with underlying COPD/emphysema--- baseline patient uses 3 L of oxygen at home--- admission patient had increased work of breathing and worsening hypoxia requiring BiPAP tonight.,  Weaned off BiPAP already as of 11/27/2018, back on nasal cannula at 3 L/min  ... Chest x-ray and clinical exam does not reflect significant volume overload or pneumonia--- patient has chronic lower extremity edema, per patient unchanged from baseline  3)AKI----acute kidney injury on CKD stage III-IV or/Versus progression of CKD --   creatinine on admission= 2.57 ,   baseline creatinine = 1.3 (but it was 2.8 on 10/11/2018 at [facility])   , creatinine is now= 2.1, continue to  hold Lasix, apparently recently on Bactrim DS, hold metolazone, hold lisinopril, hold metformin, renally adjust medications, avoid nephrotoxic agents/dehydration/hypotension---   4)DM2--last A1c 7.2, reflecting fair diabetic  control, patient will be n.p.o. due to GI bleed, Allow some permissive Hyperglycemia rather than risk life-threatening hypoglycemia in a patient with unreliable oral intake. Use Novolog/Humalog Sliding scale insulin with Accu-Cheks/Fingersticks as ordered---   reduced Lantus insulin  to 34 units from 70 units every hs until oral intake is more reliable,  continue to hold metformin due to kidney concerns, hold [place]   5)CAD--- patient with history of CAD status post previous PCI and CABG of LAD in 1993---  okay to restart aspirin H&H is stable on EGD without ongoing bleeding, no ACS symptoms at this time, blood pressure has been soft,   , decrease  metoprolol to 12.5 mg twice daily, hold isosorbide for now, and restarted at 30 mg on 11/28/2018,  continue Crestor 20 mg daily, given history of CAD we will try to keep hemoglobin close to 9   6)H/o Aortic Stenosis--- status post previous TAVR in February 2017--- echo from 10/04/2018 with normal functioning aortic valve, EF 60 to 65%  7) leukocytosis--- hold off on further antibiotics at this time, WBC is down to  12.2  from 16.1.Marland KitchenMarland Kitchen Patient may have had reactive leukocytosis on admission due to acute GI bleed, awaiting blood and urine cultures  8)right upper lobe nodular opacity ----- which will  require further characterization with CT with contrast which we cannot do now due to kidney function  9)PAFib--- patient appears to have paroxysmal atrial fibrillation... CHA2DS2- VASc score   is = 7     Which is  equal to = 11.2  % annual risk of stroke , not a candidate for anticoagulation due to acute GI bleed as well as history of chronic blood loss/anemia requiring transfusion from time to time--- Cardizem has been discontinued due to soft blood pressures, metoprolol has been reduced to 12.5 mg twice daily for CAD and for rate control  Disposition/Need for in-Hospital Stay- patient unable to be discharged at this time due to acute GI bleed requiring transfusion with soft hemodynamics at this time... Worsening respiratory status requiring BiPAP use--  Code Status : Full  Family Communication:   na   Disposition Plan  : TBD  Consults  :  Gi  DVT Prophylaxis  :    - SCDs (Gi bleed)  Lab Results  Component Value Date   PLT 248 11/28/2018    Inpatient Medications  Scheduled Meds: . Chlorhexidine Gluconate Cloth  6 each Topical Q0600  . feeding supplement  (GLUCERNA SHAKE)  237 mL Oral TID BM  . furosemide  40 mg Intravenous Once  . insulin aspart  0-9 Units Subcutaneous Q4H  . insulin glargine  34 Units Subcutaneous QHS  . isosorbide mononitrate  30 mg Oral Daily  . lidocaine      . meperidine      . metoprolol tartrate  12.5 mg Oral BID  . midazolam      . mupirocin ointment  1 application Nasal BID  . nystatin   Topical BID  . pantoprazole (PROTONIX) IV  40 mg Intravenous Q12H  . rosuvastatin  20 mg Oral Daily  . sodium chloride flush  3 mL Intravenous Q12H   Continuous Infusions: . sodium chloride    . sodium chloride 50 mL/hr at 11/27/18 1339  . sodium chloride    . albuterol 10 mg/hr (11/26/18 1335)   PRN Meds:.sodium chloride, acetaminophen **OR** acetaminophen, albuterol, amitriptyline, lidocaine, ondansetron **OR** ondansetron (ZOFRAN) IV, oxyCODONE, polyethylene glycol, sodium chloride flush, traMADol, traZODone    Anti-infectives (From admission, onward)  Start     Dose/Rate Route Frequency Ordered Stop   11/26/18 1445  cefTRIAXone (ROCEPHIN) 1 g in sodium chloride 0.9 % 100 mL IVPB     1 g 200 mL/hr over 30 Minutes Intravenous  Once 11/26/18 1438 11/26/18 1524   11/26/18 1445  azithromycin (ZITHROMAX) tablet 500 mg     500 mg Oral  Once 11/26/18 1438 11/26/18 1458        Objective:   Vitals:   11/28/18 1530 11/28/18 1600 11/28/18 1705 11/28/18 1804  BP: (!) 121/56 118/88 (!) 151/90 (!) 95/49  Pulse: 95 94  95  Resp: 19 18  (!) 27  Temp:      TempSrc:      SpO2: 97% 96%  100%  Weight:      Height:        Wt Readings from Last 3 Encounters:  11/28/18 113.2 kg  11/08/18 110.5 kg  05/03/18 106.6 kg     Intake/Output Summary (Last 24 hours) at 11/28/2018 1825 Last data filed at 11/28/2018 0500 Gross per 24 hour  Intake 3 ml  Output 900 ml  Net -897 ml     Physical Exam Patient is examined daily including today on 11/28/18 , exams remain the same as of yesterday except that has changed   Physical  Examination: General appearance - alert,  able to speak in short sentences Nose-  [place] 3 L/min mental status - alert, oriented to person, place, and time,  Eyes - sclera anicteric Neck - supple, no JVD elevation , Chest -diminished in bases, no wheezing or rales  heart - S1 and S2 normal, irregularly irregular,  CABG Abdomen - soft, nontender, nondistended, no masses or organomegaly Neurological - screening mental status exam normal, neck supple without rigidity, cranial nerves II through XII intact, DTR's normal and symmetric Extremities -1 + pitting edema pedal edema noted, intact peripheral pulses  Skin - warm, dry   Data Review:   Micro Results Recent Results (from the past 240 hour(s))  Urine Culture     Status: None   Collection Time: 11/26/18  4:44 AM  Result Value Ref Range Status   Specimen Description   Final    URINE, CLEAN CATCH Performed at [facility], [address], [place], [place] [address]    Special Requests   Final    Normal Performed at [facility], [address], [place], [place] [address]    Culture   Final    NO GROWTH Performed at [facility], [place] [address] [place], [place] [address]    Report Status 11/28/2018 FINAL  Final  MRSA PCR Screening     Status: Abnormal   Collection Time: 11/26/18  6:25 PM  Result Value Ref Range Status   MRSA by PCR POSITIVE (A) NEGATIVE Final    Comment:        The GeneXpert MRSA Assay (FDA approved for NASAL specimens only), is one component of a comprehensive MRSA colonization surveillance program. It is not intended to diagnose MRSA infection nor to guide or monitor treatment for MRSA infections. RESULT CALLED TO, READ BACK BY AND VERIFIED WITH: DANIELS,J @ 6269 ON 11/27/18 BY JUW Performed at [facility], [address], [place],  [address]   Culture, blood (Routine X 2) w Reflex to ID Panel     Status: None (Preliminary result)   Collection Time: 11/26/18  9:36 PM  Result Value Ref Range  Status   Specimen Description RIGHT ANTECUBITAL  Final  Special Requests   Final    BOTTLES DRAWN AEROBIC AND ANAEROBIC Blood Culture adequate volume   Culture   Final    NO GROWTH 2 DAYS Performed at [facility], [address] [place], [place] [address]    Report Status PENDING  Incomplete    Radiology Reports Dg Chest Portable 1 View  Result Date: 11/26/2018 CLINICAL DATA:  Worsening shortness of breath over the past week. History of COPD. EXAM: PORTABLE CHEST 1 VIEW COMPARISON:  Single-view of the chest 10/19/2016 and 10/08/2015. PA and lateral chest 10/13/2016. FINDINGS: Focal nodular opacity is seen in the right upper lung zone. The lungs otherwise appear clear. No pneumothorax. Blunted appearance of the costophrenic angles is likely due to portable technique and the patient's body habitus. Cardiomegaly is seen. IMPRESSION: Right upper lobe nodular opacity can not be definitively characterized. Recommend CT chest with contrast for further evaluation. Marked cardiomegaly. Electronically Signed   By: Inge Rise M.D.   On: 11/26/2018 13:17     CBC Recent Labs  Lab 11/26/18 1302 11/26/18 2135 11/27/18 0437 11/27/18 1512 11/28/18 0510  WBC 16.1* 11.2* 10.3 11.4* 12.2*  HGB 7.2* 7.4* 7.1* 8.4* 8.6*  HCT 24.3* 24.1* 23.7* 27.1* 28.9*  PLT 289 230 226 227 248  MCV 93.5 92.3 94.0 93.4 94.1  MCH 27.7 28.4 28.2 29.0 28.0  MCHC 29.6* 30.7 30.0 31.0 29.8*  RDW 15.6* 15.1 15.1 15.0 15.2  LYMPHSABS 1.7  --   --   --   --   MONOABS 1.0  --   --   --   --   EOSABS 0.0  --   --   --   --   BASOSABS 0.1  --   --   --   --     Chemistries  Recent Labs  Lab 11/26/18 1302 11/27/18 0437 11/28/18 0510  NA 134* 136 137  K 4.6 4.5 5.1  CL 100 105 106  CO2 22 26 24   GLUCOSE 180* 107* 136*  BUN 50* 45* 44*  CREATININE 2.57* 2.44* 2.18*  CALCIUM 8.7* 8.0* 8.6*  MG 2.5*  --   --     ------------------------------------------------------------------------------------------------------------------ No results for input(s): CHOL, HDL, LDLCALC, TRIG, CHOLHDL, LDLDIRECT in the last 72 hours.  Lab Results  Component Value Date   HGBA1C 7.2 (H) 01/20/2018   ------------------------------------------------------------------------------------------------------------------ Recent Labs    11/26/18 1302  TSH 1.680   ------------------------------------------------------------------------------------------------------------------ No results for input(s): VITAMINB12, FOLATE, FERRITIN, TIBC, IRON, RETICCTPCT in the last 72 hours.  Coagulation profile No results for input(s): INR, PROTIME in the last 168 hours.  No results for input(s): DDIMER in the last 72 hours.  Cardiac Enzymes Recent Labs  Lab 11/26/18 1302  TROPONINI <0.03   ------------------------------------------------------------------------------------------------------------------    Component Value Date/Time   BNP 347.0 (H) 11/26/2018 1302     Roxan Hockey M.D on 11/28/2018 at 6:25 PM  Go to [email or web address] - for contact info  [facility] - Office  [phone]

## 2018-11-28 NOTE — Op Note (Signed)
[facility] Patient Name: Rachel Suarez Procedure Date: 11/28/2018 10:16 AM MRN: [id number] Date of Birth: 1941-05-13 Attending MD: Hildred Laser , MD CSN: [id number] Age: 78 Admit Type: Inpatient Procedure:                Upper GI endoscopy Indications:              Acute post hemorrhagic anemia, Melena Providers:                Hildred Laser, MD, Janeece Riggers, RN, Aram Candela Referring MD:             Roxan Hockey, MD Medicines:                Lidocaine spray, Meperidine 25 mg IV, Midazolam 2                            mg IV Complications:            No immediate complications. Estimated Blood Loss:     Estimated blood loss: none. Procedure:                Pre-Anesthesia Assessment:                           - Prior to the procedure, a History and Physical                            was performed, and patient medications and                            allergies were reviewed. The patient's tolerance of                            previous anesthesia was also reviewed. The risks                            and benefits of the procedure and the sedation                            options and risks were discussed with the patient.                            All questions were answered, and informed consent                            was obtained. Prior Anticoagulants: The patient                            last took aspirin 2 days prior to the procedure.                            ASA Grade Assessment: III - A patient with severe                            systemic disease. After reviewing the risks and  benefits, the patient was deemed in satisfactory                            condition to undergo the procedure.                           After obtaining informed consent, the endoscope was                            passed under direct vision. Throughout the                            procedure, the patient's blood pressure, pulse, and          oxygen saturations were monitored continuously. The                            [id number]-H190 ([id number]) was introduced through the                            mouth, and advanced to the second part of duodenum.                            The upper GI endoscopy was accomplished without                            difficulty. The patient tolerated the procedure                            well. Scope In: 2:06:31 PM Scope Out: 2:10:29 PM Total Procedure Duration: 0 hours 3 minutes 58 seconds  Findings:      The examined esophagus was normal.      The Z-line was irregular and was found 39 cm from the incisors.      Mild gastric antral vascular ectasia without bleeding was present in the       gastric antrum.      The exam of the stomach was otherwise normal.      The duodenal bulb and second portion of the duodenum were normal. Impression:               - Normal esophagus.                           - Z-line irregular, 39 cm from the incisors.                           - Gastric antral vascular ectasia without bleeding.                           - Normal duodenal bulb and second portion of the                            duodenum.                           - No specimens collected.  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                            No bleeding source idenified. Moderate Sedation:      Moderate (conscious) sedation was administered by the endoscopy nurse       and supervised by the endoscopist. The following parameters were       monitored: oxygen saturation, heart rate, blood pressure, CO2       capnography and response to care. Total physician intraservice time was       10 minutes. Recommendation:           - Return patient to ICU for ongoing care.                           - Cardiac diet today.                           - Continue present medications.                           - CBC in am.                           - Hold Aspirin for  now. Procedure Code(s):        --- Professional ---                           [phone], Esophagogastroduodenoscopy, flexible,                            transoral; diagnostic, including collection of                            specimen(s) by brushing or washing, when performed                            (separate procedure)                           G0500, Moderate sedation services provided by the                            same physician or other qualified health care                            professional performing a gastrointestinal                            endoscopic service that sedation supports,                            requiring the presence of an independent trained                            observer to assist in the monitoring of the  patient's level of consciousness and physiological                            status; initial 15 minutes of intra-service time;                            patient age 49 years or older (additional time may                            be reported with [phone], as appropriate) Diagnosis Code(s):        --- Professional ---                           K22.8, Other specified diseases of esophagus                           K31.819, Angiodysplasia of stomach and duodenum                            without bleeding                           D62, Acute posthemorrhagic anemia                           K92.1, Melena (includes Hematochezia) CPT copyright 2018 American Medical Association. All rights reserved. The codes documented in this report are preliminary and upon coder review may  be revised to meet current compliance requirements. Hildred Laser, MD Hildred Laser, MD 11/28/2018 2:25:18 PM This report has been signed electronically. Number of Addenda: 0

## 2018-11-28 NOTE — TOC Initial Note (Signed)
Transition of Care [facility]) - Initial/Assessment Note    Patient Details  Name: Rachel Suarez MRN: [id number] Date of Birth: 05-02-1941  Transition of Care [facility]) CM/SW Contact:    Skyler Dusing Dimitri Ped, LCSW Phone Number: 11/28/2018, 4:29 PM  Clinical Narrative:  Pt is a 78 year old caucasian female with a hx of CHF and COPD. CSW met with Pt at bedside to discuss discharge needs. CSW learned that Pt lived at home with the help of a niece prior to her admission. Pt stated that she has everything she needs and did not need any assistance with DME needs. Pt goes into detail and explains that while at home, her niece assists with tasks such as cooking and cleaning. Pt explains that she can ambulate without the need of any assistive devices. Pt states that she is looking to get her 02 needs met from [employer]. Pt will discharge with 02 3L at home. Pt became defensive and guarded as CSW continued to gather information needed to assist in discharge planning. CSW asked permission to gather further information from a relative and permission was denied. Pt stated " im done answering questions". CSW will continue to follow up.   [place] Clinical Social Worker    Expected Discharge Plan: Home/Self Care(home with support from [employer]) Barriers to Discharge: No Barriers Identified   Patient Goals and CMS Choice Patient states their goals for this hospitalization and ongoing recovery are:: to discharge home with support of family   Choice offered to / list presented to : NA  Expected Discharge Plan and Services Expected Discharge Plan: Home/Self Care(home with support from [facility] and [employer]) In-house Referral: NA Discharge Planning Services: CM Consult Post Acute Care Choice: NA Living arrangements for the past 2 months: Single Family Home                     HH Arranged: NA    Prior Living Arrangements/Services Living arrangements for the past 2 months: Single Family Home Lives  with:: Relatives   Do you feel safe going back to the place where you live?: Yes      Need for Family Participation in Patient Care: No (Comment) Care giver support system in place?: Yes (comment)(Family available in home setting to assist Pt ) Current home services: DME    Activities of Daily Living Home Assistive Devices/Equipment: CBG Meter ADL Screening (condition at time of admission) Patient's cognitive ability adequate to safely complete daily activities?: Yes Is the patient deaf or have difficulty hearing?: No Does the patient have difficulty seeing, even when wearing glasses/contacts?: No Does the patient have difficulty concentrating, remembering, or making decisions?: No Patient able to express need for assistance with ADLs?: Yes Does the patient have difficulty dressing or bathing?: No Independently performs ADLs?: Yes (appropriate for developmental age) Does the patient have difficulty walking or climbing stairs?: Yes Weakness of Legs: Both Weakness of Arms/Hands: Both  Permission Sought/Granted Permission sought to share information with : Case Manager Permission granted to share information with : Yes, Verbal Permission Granted, No              Emotional Assessment   Attitude/Demeanor/Rapport: Suspicious, Guarded, Apprehensive(Pt appeared frustrated at CSW's questions and refused to answer any further. Pt did not grant CSW permission to speak with neice to gather more information concerning discharge needs ) Affect (typically observed): Overwhelmed, Frustrated Orientation: : Oriented to Self, Oriented to  Time, Oriented to Place, Oriented to Situation  Psych Involvement: No (comment)  Admission diagnosis:  GI bleed [K92.2] Patient Active Problem List   Diagnosis Date Noted  . Paroxysmal A-fib ([place]) 11/27/2018  . GI bleed 11/26/2018  . Acute on chronic respiratory failure with hypoxia ([place]) 11/26/2018  . CAD (coronary artery disease), native coronary  artery/PCI/CABG--1993 11/26/2018  . 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 Respiratory failure with hypoxia ([place]) 11/26/2018  . Morbid obesity ([place]) 05/25/2017  . Oxygen dependent 05/25/2017  . CKD (chronic kidney disease) stage 3, GFR 30-59 ml/min (HCC) 12/11/2016  . Gout 12/11/2016  . Vitamin D deficiency 07/28/2016  . Hx of colonic polyps 04/20/2016  . Anemia 12/18/2015  . 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 Heart disease 03/06/2015  . DM2 (diabetes mellitus, type 2) ([place]) 03/06/2015  . GERD (gastroesophageal reflux disease) 03/06/2015  . Family hx of colon cancer 03/06/2015  . High cholesterol 03/06/2015  . Congestive heart failure (CHF) ([place]) 09/21/2011  . Essential hypertension 09/21/2011   PCP:  [name], MD Pharmacy:   [place], [place] [address] [place] [address] Phone: [phone] Fax: [phone]  [place], [place] [address] Phone: [phone] Fax: [phone]  [facility] [phone] - [place], [place] - [address] [place] [address] Phone: [phone] Fax: [phone]     Social Determinants of Health (SDOH) Interventions    Readmission Risk Interventions No flowsheet data found.

## 2018-11-28 NOTE — Progress Notes (Signed)
RT removed BIPAP and placed the Pt on 2L [place] at 808

## 2018-11-28 NOTE — Progress Notes (Signed)
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  Source of bleeding not identified.

## 2018-11-29 LAB — CBC
HCT: 28 % — ABNORMAL LOW (ref 36.0–46.0)
HCT: 28.8 % — ABNORMAL LOW (ref 36.0–46.0)
Hemoglobin: 8.2 g/dL — ABNORMAL LOW (ref 12.0–15.0)
Hemoglobin: 8.8 g/dL — ABNORMAL LOW (ref 12.0–15.0)
MCH: 28.1 pg (ref 26.0–34.0)
MCH: 29.3 pg (ref 26.0–34.0)
MCHC: 29.3 g/dL — AB (ref 30.0–36.0)
MCHC: 30.6 g/dL (ref 30.0–36.0)
MCV: 95.9 fL (ref 80.0–100.0)
MCV: 96 fL (ref 80.0–100.0)
PLATELETS: 247 10*3/uL (ref 150–400)
Platelets: 215 10*3/uL (ref 150–400)
RBC: 2.92 MIL/uL — ABNORMAL LOW (ref 3.87–5.11)
RBC: 3 MIL/uL — AB (ref 3.87–5.11)
RDW: 15 % (ref 11.5–15.5)
RDW: 15.1 % (ref 11.5–15.5)
WBC: 7.5 10*3/uL (ref 4.0–10.5)
WBC: 10.1 10*3/uL (ref 4.0–10.5)
nRBC: 0 % (ref 0.0–0.2)
nRBC: 0 % (ref 0.0–0.2)

## 2018-11-29 LAB — GLUCOSE, CAPILLARY
GLUCOSE-CAPILLARY: 131 mg/dL — AB (ref 70–99)
Glucose-Capillary: 108 mg/dL — ABNORMAL HIGH (ref 70–99)
Glucose-Capillary: 144 mg/dL — ABNORMAL HIGH (ref 70–99)
Glucose-Capillary: 148 mg/dL — ABNORMAL HIGH (ref 70–99)

## 2018-11-29 MED ORDER — PANTOPRAZOLE SODIUM 40 MG PO TBEC: 40.0000 mg | DELAYED_RELEASE_TABLET | Freq: Every day | ORAL | 1 refills | Status: AC

## 2018-11-29 MED ORDER — ISOSORBIDE MONONITRATE ER 30 MG PO TB24: 30.0000 mg | ORAL_TABLET | Freq: Every day | ORAL | 2 refills | Status: AC

## 2018-11-29 MED ORDER — METOLAZONE 2.5 MG PO TABS: 2.5000 mg | ORAL_TABLET | ORAL | 2 refills | Status: AC

## 2018-11-29 MED ORDER — METOPROLOL TARTRATE 25 MG PO TABS: 12.5000 mg | ORAL_TABLET | Freq: Two times a day (BID) | ORAL | 3 refills | Status: AC

## 2018-11-29 MED ORDER — NYSTATIN 100000 UNIT/GM EX POWD: Freq: Two times a day (BID) | CUTANEOUS | 0 refills | Status: AC

## 2018-11-29 MED ORDER — DICLOFENAC SODIUM 3 % TD GEL: 1.0000 "application " | Freq: Two times a day (BID) | TRANSDERMAL | 2 refills | Status: AC | PRN

## 2018-11-29 MED ORDER — SENNOSIDES-DOCUSATE SODIUM 8.6-50 MG PO TABS: 2.0000 | ORAL_TABLET | Freq: Two times a day (BID) | ORAL | 1 refills | Status: AC

## 2018-11-29 MED ORDER — DILTIAZEM HCL ER COATED BEADS 120 MG PO CP24: 120.0000 mg | ORAL_CAPSULE | Freq: Every day | ORAL | 11 refills | Status: AC

## 2018-11-29 MED ORDER — FUROSEMIDE 40 MG PO TABS: 20.0000 mg | ORAL_TABLET | Freq: Two times a day (BID) | ORAL | 1 refills | Status: AC

## 2018-11-29 MED ORDER — INSULIN GLARGINE 100 UNIT/ML SOLOSTAR PEN: 34.0000 [IU] | PEN_INJECTOR | Freq: Every day | SUBCUTANEOUS | 3 refills | Status: AC

## 2018-11-29 MED ORDER — ASPIRIN EC 81 MG PO TBEC: 81.0000 mg | DELAYED_RELEASE_TABLET | Freq: Every day | ORAL | 2 refills | Status: AC

## 2018-11-29 NOTE — Progress Notes (Signed)
  Subjective:  Patient complains of back pain.  She states she has had spasms removed before she came to the hospital.  She denies nausea vomiting or abdominal pain.  Her bowel movement early this morning was dark but she had another stool later it was liquid brown.  Objective: Blood pressure 112/66, pulse 79, temperature 98.3 F (36.8 C), temperature source Axillary, resp. rate (!) 22, height 5\' 2"  (1.575 m), weight 113.2 kg, SpO2 98 %. Patient is alert and in no acute distress. She appears pale. Abdomen is protuberant but soft and nontender with organomegaly or masses.  Labs/studies Results:  CBC Latest Ref Rng & Units 11/29/2018 11/28/2018 11/27/2018  WBC 4.0 - 10.5 K/uL 7.5 12.2(H) 11.4(H)  Hemoglobin 12.0 - 15.0 g/dL 8.2(L) 8.6(L) 8.4(L)  Hematocrit 36.0 - 46.0 % 28.0(L) 28.9(L) 27.1(L)  Platelets 150 - 400 K/uL 215 248 227    CMP Latest Ref Rng & Units 11/28/2018 11/27/2018 11/26/2018  Glucose 70 - 99 mg/dL 136(H) 107(H) 180(H)  BUN 8 - 23 mg/dL 44(H) 45(H) 50(H)  Creatinine 0.44 - 1.00 mg/dL 2.18(H) 2.44(H) 2.57(H)  Sodium 135 - 145 mmol/L 137 136 134(L)  Potassium 3.5 - 5.1 mmol/L 5.1 4.5 4.6  Chloride 98 - 111 mmol/L 106 105 100  CO2 22 - 32 mmol/L 24 26 22   Calcium 8.9 - 10.3 mg/dL 8.6(L) 8.0(L) 8.7(L)  Total Protein 6.1 - 8.1 g/dL - - -  Total Bilirubin 0.2 - 1.2 mg/dL - - -  AST 10 - 35 U/L - - -  ALT 6 - 29 U/L - - -    Hepatic Function Latest Ref Rng & Units 10/13/2017 07/14/2017  Total Protein 6.1 - 8.1 g/dL 6.9 6.7  AST 10 - 35 U/L 24 16  ALT 6 - 29 U/L 26 10  Total Bilirubin 0.2 - 1.2 mg/dL 0.3 0.4     Assessment:  #1.  Acute on chronic GI bleed.  Patient has received 2 units of PRBCs.  H&H is gradually trickling down.  Stool is not melanic anymore.  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  Therefore she must be losing blood from small bowel or proximal colon.  Last colonoscopy was in October 2017 with removal of  multiple tubular adenomas and she also had sigmoid diverticulosis.   Patient has multiple comorbidities and high risk for any intervention. If there is evidence of recurrent bleed will proceed with GI bleeding scan. She is to have H&H drawn this afternoon.

## 2018-11-29 NOTE — TOC Transition Note (Signed)
Transition of Care [facility]) - CM/SW Discharge Note   Patient Details  Name: Rachel Suarez MRN: [id number] Date of Birth: 06-30-1941  Transition of Care [facility] CM/SW Contact:  Shade Flood, LCSW Phone Number: 11/29/2018, 1:53 PM   Clinical Narrative:     Pt has now changed her mind and will accept [facility] care at dc. She requests referral to [facility] as she has had them in the past. Referral given to [facility] from [place]. There are no other TOC needs for dc.  Final next level of care: Home w Home Health Services Barriers to Discharge: No Barriers Identified   Patient Goals and CMS Choice Patient states their goals for this hospitalization and ongoing recovery are:: to discharge home with support of family   Choice offered to / list presented to : NA  Discharge Placement                       Discharge Plan and Services In-house Referral: NA Discharge Planning Services: CM Consult Post Acute Care Choice: NA              HH Arranged: RN, PT [place] Agency: [place] [facility])   Social Determinants of Health (SDOH) Interventions     Readmission Risk Interventions Readmission Risk Prevention Plan 11/29/2018  Transportation Screening Complete  PCP or Specialist Appt within 3-5 Days Complete  HRI or [place] Complete  Social Work Consult for [place] Planning/Counseling Complete  Palliative Care Screening Not Applicable  Medication Review [employer]) Complete  Some recent data might be hidden

## 2018-11-29 NOTE — Discharge Summary (Signed)
Rachel Suarez, is a 78 y.o. female  DOB 1940-10-12  MRN [id number].  Admission date:  11/26/2018  Admitting Physician  Roxan Hockey, MD  Discharge Date:  11/29/2018   Primary MD  [name], MD  Recommendations for primary care physician for things to follow:   1)You have Right upper lobe nodular opacity ----- which will  require further characterization with CT with contrast which we cannot do now due to kidney function at this time--- follow-up with the primary care physician within the next month to discuss right lung abnormality and the need for further evaluation  2)Avoid ibuprofen/Advil/Aleve/Motrin/Goody Powders/Naproxen/BC powders/Meloxicam/Diclofenac/Indomethacin and other Nonsteroidal anti-inflammatory medications as these will make you more likely to bleed and can cause stomach ulcers, can also cause Kidney problems.   3) your blood pressure is kind of soft so decrease Lasix from 40 mg twice a day to 20 mg twice a day, decrease Cardizem 240 mg daily from 180 mg daily, decrease isosorbide/Imdur from 60 mg daily to 30 mg daily, change metolazone to 2.5 mg on Monday Wednesday Fridays only instead of daily... Also decreased metoprolol titrate to 12.5 mg twice a day from 25 mg twice a day  4) please check blood sugars often--- STOP metformin due to kidney concerns, please reduce your Lantus insulin to 34 units daily from 70 units daily to avoid low blood sugars  [place] RN/Nurse will need to draw your blood/CBC test in a week from now and send the results to Dr. 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 Allergy   . Anemia   . Arthritis   . Asthma   . Cataract   . CHF (congestive heart failure) ([place])   . CKD (chronic kidney disease) stage 3, GFR 30-59 ml/min (HCC) 12/11/2016  . COPD (chronic obstructive pulmonary disease) ([place])   . Diabetes ([place])    x 30 yrs  . Emphysema of lung ([place])   . Essential hypertension 09/21/2011  . GERD (gastroesophageal reflux disease)   . Heart disease   . High cholesterol   . Oxygen deficiency     Past Surgical History:  Procedure Laterality Date  . AORTIC VALVE REPLACEMENT    . APPENDECTOMY    . CHOLECYSTECTOMY    . COLONOSCOPY N/A 03/29/2015   Procedure: COLONOSCOPY;  Surgeon: Rogene Houston, MD;  [facility];  Service: Endoscopy;  Laterality: N/A;  1015 - moved to 1:10 - Ann to notify  .  COLONOSCOPY N/A 06/24/2016   Procedure: COLONOSCOPY;  Surgeon: Rogene Houston, MD;  [facility];  Service: Endoscopy;  Laterality: N/A;  245 - moved to 10/18 @ 1:00 - Ann to notify pt  . CORONARY ARTERY BYPASS GRAFT  1993  . ESOPHAGEAL DILATION N/A 03/29/2015   Procedure: ESOPHAGEAL DILATION;  Surgeon: Rogene Houston, MD;  [facility];  Service: Endoscopy;  Laterality: N/A;  . ESOPHAGOGASTRODUODENOSCOPY N/A 03/29/2015   Procedure: ESOPHAGOGASTRODUODENOSCOPY (EGD);  Surgeon: Rogene Houston, MD;  [facility];  Service: Endoscopy;  Laterality: N/A;  . GIVENS CAPSULE STUDY N/A 12/25/2015   Procedure: GIVENS CAPSULE STUDY;  Surgeon: Rogene Houston, MD;  [facility];  Service: Endoscopy;  Laterality: N/A;       HPI  from the history and physical done on the day of admission:    Rachel Suarez  is a 78 y.o. female with past medical history relevant for CAD with previous PCI to  LAD, CABG to LAD 1993, CKD stage III-IV (baseline creatinine previously around 1.3 but recently as high as 2.8 on 10/11/2018), status post TAVR February 2017, HFpEF with EF of 60 to 65% per echo from 10/04/2018, chronic anemia of multifactorial etiology including CKD requiring transfusion from time to time, well as history of diabetes mellitus, hypertension and chronic hypoxic respiratory failure secondary to COPD/emphysema presenting to the ED with worsening shortness of breath for the last week or so as well as complains of dark stools.... She does take iron tablets however--  In ED... She is found to have conversational dyspnea, tachypnea and increased work of breathing, required BiPAP for extended period of time, RT attempted ABG but only got venous blood  In ED... Hemoglobin is down to 7.2 from a baseline of between 11 and 12, stool Hemoccult is positive  No fevers no chills, .. No productive cough...   Patient had nausea from BiPAP use but no emesis, no diarrhea  She had chest discomfort and dyspnea but no frank chest pain, complains of knee discomfort but no calf or leg pains per se patient has chronic lower extremity edema which is unchanged from a baseline.... No pleuritic symptoms  In ED--- chest x-ray without overt CHF however patient does have cardiomegaly, there is right upper lobe nodular opacity which will  require further characterization with CT with contrast which we cannot do now due to kidney function  TSH is 1.6, mag is 2.5, BNP is 347 without prior baseline.... Creatinine is 2.57, previous creatinine baseline was around 1.3, however on 10/11/2018 at [facility] patient's creatinine was 2.8, CBC with a white count of 16.1..... Patient appears to have chronic leukocytosis   Hospital Course:    Brief Summary:- 78 y.o.femalewith past medical history relevant for CAD with previous PCI to LAD, CABG to LAD 1993, CKD stage III-IV(baseline creatinine previously  around 1.3 but recently as high as 2.8 on 10/11/2018), status post TAVR February 2017, HFpEFwith EF of 60 to 65% per echo from 10/04/2018,chronic anemia of multifactorial etiology including CKD requiring transfusion from time to time, well as history of diabetes mellitus, hypertension and chronic hypoxic respiratory failure secondary to COPD/emphysema admitted on 11/26/2018 with acute on chronic Anemia secondary to presumed GI bleed as well as acute on chronic hypoxic respiratory failure requiring BiPAP with increased work of breathing secondary to acute blood loss anemia   Plan:- 1)Acute on chronic Anemia secondary to Acute GI bleed----somewhat acute blood loss anemia superimposed on  her chronic anemia hemoglobin was down to 7.1  (admission hemoglobin was 7.2,  hemoglobin stable and trending slightly up at 8.8 after 2 units of packed cells,   Baseline hgb is usually between 11.5 and 12...Marland KitchenMarland KitchenHemoccult positive on admission, no further melena ....Marland KitchenGI consult appreciated, EGD on 11/28/2018-- 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   Repeat CBC to be drawn by home health RN in about a week and follow-up with GI physician Dr. Dereck Leep as outpatient   2)acute on chronic hypoxic respiratory failure in a patient with underlying COPD / emphysema---baseline patient uses 3 L of oxygen at home--- admission patient had increased work of breathing and worsening hypoxia requiring BiPAP tonight.,  Weaned off BiPAP already as of 11/27/2018, back on nasal cannula at 3 L/min  .Marland KitchenMarland KitchenChest x-ray and clinical exam does not reflect significant volume overload or pneumonia---patient has chronic lower extremity  edema,per patient unchanged from baseline  3)AKI----acute kidney injury on CKD stageIII-IV or/Versusprogression of CKD--creatinine on admission= 2.57, baseline creatinine = 1.3 (but it was 2.8 on 10/11/2018 at [facility] creatinine is now= 2.1,  apparently recently on Bactrim DS, STOP metformin, renally adjust medications, avoid nephrotoxic agents/dehydration/hypotension---okay to use low-dose lisinopril  4)DM2--last A1c 7.2, reflecting fair diabetic control,  reduced Lantus insulin  to 34 units from 70 units every hs ,STOP  metformin due to kidney concerns,tinea [place]  5)CAD---patient with history of CAD status post previous PCI and CABG of LAD in 1993--- okay to restart aspirin,  H&H is stable and  EGD without ongoing bleeding, no ACS symptoms at this time, blood pressure has been soft,   , decrease  metoprolol to 12.5 mg twice daily, decrease isosorbide to 30 mg, continue Crestor 20 mg daily, given history of CAD we will try to keep hemoglobin close to 9  6)H/oAortic Stenosis---status post previous TAVR in February 2017---echo from 10/04/2018 with normal functioning aortic valve, EF 60 to 65%  7)leukocytosis---hold off on further antibiotics at this time, WBC is down to   10.1  from 16.1.Marland KitchenMarland Kitchen Patient may have had reactive leukocytosis on admission due to acute GI bleed,   blood and urine cultures remain negative  8)right upper lobe nodular opacity-----willrequire further characterization with CT with contrast which we cannot do now due tokidney function  9)PAFib--- patient appears to have paroxysmal atrial fibrillation...CHA2DS2- VASc score   is = 7     Which is  equal to = 11.2  % annual risk of stroke , not a candidate for anticoagulation due to acute GI bleed as well as history of chronic blood loss/anemia requiring transfusion from time to time--- decrease Cardizem to 120 mg from 180 mg  due to soft blood pressures, metoprolol has been reduced to  12.5 mg twice daily for CAD and for rate control, aspirin 81 mg daily for stroke prophylaxis  10)Soft BP----may be due to GI bleed with volume loss improving after medication adjustment and after transfusion----  blood pressure is kind of soft so decrease Lasix from 40 mg twice a day to 20 mg twice a day, decrease Cardizem 240 mg daily from 180 mg daily, decrease isosorbide/Imdur from 60 mg daily to 30 mg daily, change metolazone to 2.5  mg on Monday Wednesday Fridays only instead of daily... Also decreased metoprolol titrate to 12.5 mg twice a day from 25 mg twice a day   Code Status : Full  Family Communication:   na   Disposition Plan  :  Home with home health services  Consults  :  Gi  DVT Prophylaxis  :    - SCDs (Gi bleed)  Discharge Condition: stable  Follow UP--- GI as advised  Diet and Activity recommendation:  As advised  Discharge Instructions    Discharge Instructions    (HEART FAILURE PATIENTS) Call MD:  Anytime you have any of the following symptoms: 1) 3 pound weight gain in 24 hours or 5 pounds in 1 week 2) shortness of breath, with or without a dry hacking cough 3) swelling in the hands, feet or stomach 4) if you have to sleep on extra pillows at night in order to breathe.   Complete by:  As directed    Call MD for:  difficulty breathing, headache or visual disturbances   Complete by:  As directed    Call MD for:  persistant dizziness or light-headedness   Complete by:  As directed    Call MD for:  persistant nausea and vomiting   Complete by:  As directed    Call MD for:  severe uncontrolled pain   Complete by:  As directed    Call MD for:  temperature >100.4   Complete by:  As directed    Diet - low sodium heart healthy   Complete by:  As directed    Discharge instructions   Complete by:  As directed    1)You have Right upper lobe nodular opacity ----- which will  require further characterization with CT with contrast which we cannot do now due to  kidney function at this time--- follow-up with the primary care physician within the next month to discuss right lung abnormality and the need for further evaluation  2)Avoid ibuprofen/Advil/Aleve/Motrin/Goody Powders/Naproxen/BC powders/Meloxicam/Diclofenac/Indomethacin and other Nonsteroidal anti-inflammatory medications as these will make you more likely to bleed and can cause stomach ulcers, can also cause Kidney problems.   3) your blood pressure is kind of soft so decrease Lasix from 40 mg twice a day to 20 mg twice a day, decrease Cardizem 240 mg daily from 180 mg daily, decrease isosorbide/Imdur from 60 mg daily to 30 mg daily, change metolazone to 2.5 mg on Monday Wednesday Fridays only instead of daily... Also decreased metoprolol titrate to 12.5 mg twice a day from 25 mg twice a day  4) please check blood sugars often--- STOP metformin due to kidney concerns, please reduce your Lantus insulin to 34 units daily from 70 units daily to avoid low blood sugars  [place] RN/Nurse will need to draw your blood/CBC test in a week from now and send the results to Dr. Dereck Leep   Increase activity slowly   Complete by:  As directed         Discharge Medications     Allergies as of 11/29/2018      Reactions   Levofloxacin Swelling   Patient states that she had Swelling in her hands , feet, numbness, and felt like bees were stinging her.      Medication List    STOP taking these medications   docusate sodium 100 MG capsule Commonly known as:  Colace   levofloxacin 500 MG tablet Commonly known as:  LEVAQUIN   metFORMIN 500 MG tablet Commonly known  as:  GLUCOPHAGE   sulfamethoxazole-trimethoprim 800-160 MG tablet Commonly known as:  BACTRIM DS,SEPTRA DS     TAKE these medications   albuterol 108 (90 Base) MCG/ACT inhaler Commonly known as:  PROVENTIL HFA;VENTOLIN HFA Inhale 1-2 puffs into the lungs every 4 (four) hours as needed for wheezing or shortness of breath.    albuterol (2.5 MG/3ML) 0.083% nebulizer solution Commonly known as:  PROVENTIL INHALE CONTENTS OF 1 VIAL BY NEBULIZER EVERY 4 HOURS AS NEEDED   Alive [employer] Tabs Take 1 tablet by mouth daily.   amitriptyline 25 MG tablet Commonly known as:  ELAVIL Take 25 mg by mouth at bedtime as needed (pain).   aspirin EC 81 MG tablet Take 1 tablet (81 mg total) by mouth daily with breakfast. What changed:  when to take this   budesonide-formoterol 80-4.5 MCG/ACT inhaler Commonly known as:  SYMBICORT Inhale 2 puffs into the lungs 2 (two) times daily.   Diclofenac Sodium 3 % Gel Apply 1 application topically 2 (two) times daily as needed. For pain in legs/back. What changed:  when to take this   diltiazem 120 MG 24 hr capsule Commonly known as:  Cardizem CD Take 1 capsule (120 mg total) by mouth daily. What changed:    medication strength  how much to take   exenatide 10 MCG/0.04ML Sopn injection Commonly known as:  BYETTA Inject 0.04 mLs (10 mcg total) into the skin 2 (two) times daily with a meal.   Ferrex 150 150 MG capsule Generic drug:  iron polysaccharides Take 1 capsule (150 mg total) by mouth daily.   furosemide 40 MG tablet Commonly known as:  LASIX Take 0.5 tablets (20 mg total) by mouth 2 (two) times daily. What changed:  how much to take   glucose blood test strip Commonly known as:  IGlucose Test Strips Test twice daily Disp. Brand covered by insurance. Dx e11.9   Insulin Glargine 100 UNIT/ML Solostar Pen Commonly known as:  Lantus SoloStar Inject 34 Units into the skin daily at 10 pm. What changed:  how much to take   isosorbide mononitrate 30 MG 24 hr tablet Commonly known as:  IMDUR Take 1 tablet (30 mg total) by mouth daily. Start taking on:  November 30, 2018 What changed:    medication strength  how much to take   lidocaine 5 % ointment Commonly known as:  XYLOCAINE Apply 1-2 pumps up to four times daily-rub in well   linaclotide 145 MCG  Caps capsule Commonly known as:  Linzess Take 1 capsule (145 mcg total) by mouth daily before breakfast.   lisinopril 2.5 MG tablet Commonly known as:  PRINIVIL,ZESTRIL Take 1 tablet by mouth daily. Take 1 tablet by mouth daily for kidney protection   metolazone 2.5 MG tablet Commonly known as:  ZAROXOLYN Take 1 tablet (2.5 mg total) by mouth every Monday, Wednesday, and Friday. Start taking on:  November 30, 2018 What changed:  when to take this   metoprolol tartrate 25 MG tablet Commonly known as:  LOPRESSOR Take 0.5 tablets (12.5 mg total) by mouth 2 (two) times daily. What changed:  how much to take   nitroGLYCERIN 0.4 MG SL tablet Commonly known as:  NITROSTAT Place 0.4 mg under the tongue as directed.   nystatin powder Commonly known as:  MYCOSTATIN/NYSTOP Apply topically 2 (two) times daily.   ondansetron 4 MG tablet Commonly known as:  ZOFRAN TAKE ONE TABLET BY MOUTH THREE TIMES DAILY AS NEEDED   pantoprazole 40 MG  tablet Commonly known as:  Protonix Take 1 tablet (40 mg total) by mouth daily.   potassium chloride 10 MEQ CR capsule Commonly known as:  MICRO-K TAKE TWO (2) CAPSULES BY MOUTH DAILY.   rosuvastatin 20 MG tablet Commonly known as:  CRESTOR Take 1 tablet (20 mg total) by mouth daily.   senna-docusate 8.6-50 MG tablet Commonly known as:  Senokot-S Take 2 tablets by mouth 2 (two) times daily.   traMADol 50 MG tablet Commonly known as:  ULTRAM Take 50 mg by mouth every 12 (twelve) hours as needed.   UNABLE TO FIND C-BACL-GABA-IBU-PRIL - 2%-2%-5%-2%  Cream.  Apply 1-2 GM ( 1-2 pumps) up to 4 times daily, and rub in well.   UNABLE TO FIND accuchek test strips test 4 times daily, lancets test 4 times daily. Dx e11.9   UNABLE TO FIND Diabetic shoes with inserts based on patients formulary   Vitamin D3 125 MCG (5000 UT) Tabs Take 5,000 Units by mouth daily.       Major procedures and Radiology Reports - PLEASE review detailed and final  reports for all details, in brief -   Dg Chest Portable 1 View  Result Date: 11/26/2018 CLINICAL DATA:  Worsening shortness of breath over the past week. History of COPD. EXAM: PORTABLE CHEST 1 VIEW COMPARISON:  Single-view of the chest 10/19/2016 and 10/08/2015. PA and lateral chest 10/13/2016. FINDINGS: Focal nodular opacity is seen in the right upper lung zone. The lungs otherwise appear clear. No pneumothorax. Blunted appearance of the costophrenic angles is likely due to portable technique and the patient's body habitus. Cardiomegaly is seen. IMPRESSION: Right upper lobe nodular opacity can not be definitively characterized. Recommend CT chest with contrast for further evaluation. Marked cardiomegaly. Electronically Signed   By: Inge Rise M.D.   On: 11/26/2018 13:17    Micro Results   Recent Results (from the past 240 hour(s))  Urine Culture     Status: None   Collection Time: 11/26/18  4:44 AM  Result Value Ref Range Status   Specimen Description   Final    URINE, CLEAN CATCH Performed at [facility], [address] [place], [place] [address]    Special Requests   Final    Normal Performed at [facility], [address], [place], [place] [address]    Culture   Final    NO GROWTH Performed at [facility], [place] [address] [place], [place] [address]    Report Status 11/28/2018 FINAL  Final  MRSA PCR Screening     Status: Abnormal   Collection Time: 11/26/18  6:25 PM  Result Value Ref Range Status   MRSA by PCR POSITIVE (A) NEGATIVE Final    Comment:        The GeneXpert MRSA Assay (FDA approved for NASAL specimens only), is one component of a comprehensive MRSA colonization surveillance program. It is not intended to diagnose MRSA infection nor to guide or monitor treatment for MRSA infections. RESULT CALLED TO, READ BACK BY AND VERIFIED WITH: DANIELS,J @ 9147 ON 11/27/18 BY JUW Performed at [facility], [address], [place], [place] [address]    Culture, blood (Routine X 2) w Reflex to ID Panel     Status: None (Preliminary result)   Collection Time: 11/26/18  9:36 PM  Result Value Ref Range Status   Specimen Description RIGHT ANTECUBITAL  Final   Special Requests   Final    BOTTLES DRAWN AEROBIC AND ANAEROBIC Blood Culture adequate  volume   Culture   Final    NO GROWTH 3 DAYS Performed at [facility], [address], [place], [place] [address]    Report Status PENDING  Incomplete       Today   Subjective    Dion Parrow today has no new complaints.... Had brown stool no chest pains shortness of breath is better ambulated with physical therapist..... Blood pressures improved          Patient has been seen and examined prior to discharge   Objective   Blood pressure (!) 109/55, pulse (!) 44, temperature 98.3 F (36.8 C), temperature source Axillary, resp. rate (!) 26, height 5\' 2"  (1.575 m), weight 113.2 kg, SpO2 92 %.   Intake/Output Summary (Last 24 hours) at 11/29/2018 1418 Last data filed at 11/29/2018 1404 Gross per 24 hour  Intake 960 ml  Output -  Net 960 ml    Exam Physical Examination: General appearance - alert, able to  in Full short sentences Nose-  [place] 2 L/min mental status - alert, oriented to person, place, and time,  Eyes - sclera anicteric Neck - supple, no JVD elevation , Chest -improving air movement, no wheezing or rales  heart - S1 and S2 normal, irregularly irregular, (HR 40s to 90s) ,  CABG Abdomen - soft, nontender, nondistended, no masses or organomegaly Neurological - screening mental status exam normal, neck supple without rigidity, cranial nerves II through XII intact, DTR's normal and symmetric Extremities - Tracepitting edemapedal edema noted, intact peripheral pulses  Skin - warm, dry   Data Review   CBC w Diff:  Lab Results  Component Value Date   WBC 10.1 11/29/2018   HGB 8.8 (L) 11/29/2018   HCT 28.8 (L) 11/29/2018   PLT 247 11/29/2018   LYMPHOPCT 11 11/26/2018    MONOPCT 7 11/26/2018   EOSPCT 0 11/26/2018   BASOPCT 0 11/26/2018   CMP:  Lab Results  Component Value Date   NA 137 11/28/2018   K 5.1 11/28/2018   CL 106 11/28/2018   CO2 24 11/28/2018   BUN 44 (H) 11/28/2018   CREATININE 2.18 (H) 11/28/2018   CREATININE 1.07 (H) 01/20/2018   PROT 6.9 10/13/2017   BILITOT 0.3 10/13/2017   AST 24 10/13/2017   ALT 26 10/13/2017  .   Total Discharge time is about 33 minutes  Roxan Hockey M.D on 11/29/2018 at 2:18 PM  Go to [email or web address] -  for contact info  [facility] - Office  [phone]

## 2018-11-29 NOTE — Progress Notes (Signed)
Had watery dark stool moderate amount.

## 2018-11-29 NOTE — TOC Progression Note (Signed)
Transition of Care [facility]) - Progression Note    Patient Details  Name: Rachel Suarez MRN: [id number] Date of Birth: 12/16/1940  [facility] CM/SW Contact  Shade Flood, LCSW Phone Number: 11/29/2018, 1:36 PM  Clinical Narrative:     PT recommending HH PT for pt. Met with pt today to discuss. Provided CMS list of [place] options. Pt states she has had HH PT in the past and she didn't think it really helped. Pt saying she is doing better now than before she came in and she does not want a [place] referral at this time. Pt aware that if she changes her mind, LCSW can follow up and make referral.  Updated MD.  Expected Discharge Plan: Home/Self Care(home with support from [facility] and [facility]) Barriers to Discharge: No Barriers Identified  Expected Discharge Plan and Services Expected Discharge Plan: Home/Self Care(home with support from [facility] and [employer]) In-house Referral: NA Discharge Planning Services: CM Consult Post Acute Care Choice: NA Living arrangements for the past 2 months: Single Family Home                     HH Arranged: NA     Social Determinants of Health (SDOH) Interventions    Readmission Risk Interventions No flowsheet data found.

## 2018-11-29 NOTE — Evaluation (Signed)
Physical Therapy Evaluation Patient Details Name: Rachel Suarez MRN: [id number] DOB: Feb 21, 1941 Today's Date: 11/29/2018   History of Present Illness  Principal Problem: GI bleed; SOB, decreased strength and endurance .  HX COPD with 3L O2; DM, AKI, OA   Clinical Impression  Ms. Oconnor states that she normally ambulates in her home with O2 but not assistive device.  Evaluation demonstrates significant decreased activity level due to both SOB upon exertion as well as pain in her Rt knee due to chronic OA.  She has been to SNF before, approximately 2 yrs ago and would prefer not to return.  Pt is wiling to have home health come to her home      Follow Up Recommendations Home health PT    Equipment Recommendations  None recommended by PT    Recommendations for Other Services   none    Precautions / Restrictions Precautions Precautions: None Restrictions Weight Bearing Restrictions: No      Mobility  Bed Mobility Overal bed mobility: Needs Assistance Bed Mobility: Supine to Sit;Sit to Supine     Supine to sit: Min assist Sit to supine: Min assist      Transfers Overall transfer level: Modified independent Equipment used: Rolling walker (2 wheeled)                Ambulation/Gait Ambulation/Gait assistance: Supervision Gait Distance (Feet): 20 Feet(20 feet needed to rest/ 2nd x 25 ft) Assistive device: Rolling walker (2 wheeled) Gait Pattern/deviations: Decreased step length - right;Decreased step length - left;Trunk flexed   Gait velocity interpretation: <1.31 ft/sec, indicative of household ambulator        Balance    Fair + standing with B UE supported.              Pertinent Vitals/Pain Pain Assessment: 0-10 Pain Score: 6  Pain Location: RT knee : chronic due to OA  Pain Descriptors / Indicators: Aching Pain Intervention(s): Limited activity within patient's tolerance    Home Living Family/patient expects to be discharged to:: Private  residence Living Arrangements: Other relatives Available Help at Discharge: Family Type of Home: House Home Access: Stairs to enter Entrance Stairs-Rails: Right Entrance Stairs-Number of Steps: 5 Home Layout: One level Home Equipment: Walker - 2 wheels Additional Comments: normally does not use any assistive device besides O2 to ambulate.    Prior Function Level of Independence: Independent with assistive device(s)         Comments: O2     Hand Dominance   Dominant Hand: Right    Extremity/Trunk Assessment        Lower Extremity Assessment Lower Extremity Assessment: Overall WFL for tasks assessed       Communication   Communication: No difficulties  Cognition Arousal/Alertness: Awake/alert Behavior During Therapy: WFL for tasks assessed/performed Overall Cognitive Status: Within Functional Limits for tasks assessed                  Exercises General Exercises - Lower Extremity Ankle Circles/Pumps: AROM;Both;10 reps;Seated Quad Sets: AROM;10 reps;Seated Gluteal Sets: AROM;10 reps Long Arc Quad: AROM;10 reps;Seated   Assessment/Plan    PT Assessment Patient needs continued PT services  PT Problem List Decreased strength;Decreased activity tolerance;Obesity;Pain;Decreased mobility;Decreased knowledge of use of DME       PT Treatment Interventions Gait training;Therapeutic activities;Therapeutic exercise    PT Goals (Current goals can be found in the Care Plan section)  Acute Rehab PT Goals Patient Stated Goal: To go home  PT Goal Formulation: With patient  Time For Goal Achievement: 12/02/18 Potential to Achieve Goals: Good    Frequency Min 4X/week   Barriers to discharge    none       AM-PAC PT "6 Clicks" Mobility  Outcome Measure Help needed turning from your back to your side while in a flat bed without using bedrails?: A Little Help needed moving from lying on your back to sitting on the side of a flat bed without using bedrails?: A  Little Help needed moving to and from a bed to a chair (including a wheelchair)?: A Little Help needed standing up from a chair using your arms (e.g., wheelchair or bedside chair)?: None Help needed to walk in hospital room?: A Little Help needed climbing 3-5 steps with a railing? : A Lot 6 Click Score: 18    End of Session Equipment Utilized During Treatment: Gait belt Activity Tolerance: Patient tolerated treatment well Patient left: in chair;with call bell/phone within reach Nurse Communication: Mobility status PT Visit Diagnosis: Unsteadiness on feet (R26.81);Muscle weakness (generalized) (M62.81)    Time: 0301-4996 PT Time Calculation (min) (ACUTE ONLY): 50 min   Charges:   PT Evaluation $PT Eval Low Complexity: 1 Low PT Treatments $Therapeutic Exercise: 8-22 mins        Rayetta Humphrey, PT CLT [phone] 11/29/2018, 9:38 AM

## 2018-11-29 NOTE — Progress Notes (Signed)
Patient discharged. IV access removed and Patient dressed herself. Family called and rolled patient to car at short stay.

## 2018-11-29 NOTE — Progress Notes (Signed)
Hgb is 8.8 g. Patient aware. We will plan to check H&H in 2 weeks.

## 2018-11-30 ENCOUNTER — Encounter (HOSPITAL_COMMUNITY): Payer: Self-pay | Admitting: Internal Medicine

## 2018-12-01 ENCOUNTER — Telehealth (INDEPENDENT_AMBULATORY_CARE_PROVIDER_SITE_OTHER): Payer: Self-pay | Admitting: *Deleted

## 2018-12-01 LAB — CULTURE, BLOOD (ROUTINE X 2)
Culture: NO GROWTH
SPECIAL REQUESTS: ADEQUATE

## 2018-12-01 NOTE — Telephone Encounter (Signed)
Patient called and left a message that she needed to have her medication sent to [facility]. She was talking about the Pantoprazole.  I have called and talked with Cassie at [facility] , she ask the Pharmacist , Beth about this. Per [facility] is sending over the Rx they have to [facility] to be filled. I have tried numerous time to return the patient's call . It will ring then it starts being busy.

## 2019-01-25 ENCOUNTER — Encounter (INDEPENDENT_AMBULATORY_CARE_PROVIDER_SITE_OTHER): Payer: Self-pay | Admitting: Internal Medicine

## 2019-01-25 ENCOUNTER — Ambulatory Visit (INDEPENDENT_AMBULATORY_CARE_PROVIDER_SITE_OTHER): Payer: Medicare Other | Admitting: Internal Medicine

## 2019-01-25 ENCOUNTER — Other Ambulatory Visit: Payer: Self-pay

## 2019-01-25 VITALS — BP 98/48 | HR 66 | Temp 97.8°F | Resp 18 | Ht 62.0 in | Wt 243.7 lb

## 2019-01-25 DIAGNOSIS — R0602 Shortness of breath: Secondary | ICD-10-CM | POA: Diagnosis not present

## 2019-01-25 DIAGNOSIS — R197 Diarrhea, unspecified: Secondary | ICD-10-CM | POA: Diagnosis not present

## 2019-01-25 DIAGNOSIS — Z862 Personal history of diseases of the blood and blood-forming organs and certain disorders involving the immune mechanism: Secondary | ICD-10-CM

## 2019-01-25 NOTE — Progress Notes (Signed)
Presenting complaint;  Diarrhea.  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  Patient has been maintained on p.o. iron.  Patient called earlier this week and requested to be seen because of diarrhea.  Patient is accompanied by her Sister Lucita Ferrara.  She has a history of constipation and has been on Linzess.  She says since diarrhea started she has stopped Linzess.  Senokot is also listed as part of her medications and she is not taking either. She states she was hospitalized at [facility] for 1 week for pneumonia and CHF and she also had renal failure.  She was noted to be anemic.  Her stool was guaiac positive and she underwent EGD and colonoscopy by Dr. Ladona Horns. Patient says she has had diarrhea since then.  Her stool has been black but she has been on iron.  Yesterday she had 4 stools and she had 2 today.  She feels diarrhea is better.  She complains of soreness on left side of her abdomen. Patient complains of shortness of breath.  She complains of dyspnea at rest.  She states her breathing has not improved any since she was discharged from the hospital.  If anything it has gotten worse.  She has been taking all her medications including diuretics as before. Patient has been on low-dose aspirin.  During her recent hospitalization she was switched to clopidogrel.  She is nervous about taking this medication because of bleeding risk. She lives at home.  Her nephew lives with her but he works every day.  He states he has good support from her neighbors family members as well as her church.   Current Medications: Outpatient Encounter Medications as of 01/25/2019  Medication Sig  . albuterol (PROVENTIL HFA;VENTOLIN HFA) 108 (90 Base) MCG/ACT inhaler Inhale 1-2 puffs into the lungs  every 4 (four) hours as needed for wheezing or shortness of breath.  Marland Kitchen albuterol (PROVENTIL) (2.5 MG/3ML) 0.083% nebulizer solution INHALE CONTENTS OF 1 VIAL BY NEBULIZER EVERY 4 HOURS AS NEEDED  . amitriptyline (ELAVIL) 25 MG tablet Take 25 mg by mouth at bedtime as needed (pain).  . budesonide-formoterol (SYMBICORT) 80-4.5 MCG/ACT inhaler Inhale 2 puffs into the lungs 2 (two) times daily.  . Cholecalciferol (VITAMIN D3) 5000 units TABS Take 5,000 Units by mouth daily.  . clopidogrel (PLAVIX) 75 MG tablet Take 75 mg by mouth daily.  . Diclofenac Sodium 3 % GEL Apply 1 application topically 2 (two) times daily as needed. For pain in legs/back.  . diltiazem (CARDIZEM CD) 120 MG 24 hr capsule Take 1 capsule (120 mg total) by mouth daily.  Marland Kitchen exenatide (BYETTA) 10 MCG/0.04ML SOPN injection Inject 0.04 mLs (10 mcg total) into the skin 2 (two) times daily with a meal.  . FERREX 150 150 MG capsule Take 1 capsule (150 mg total) by mouth daily.  . furosemide (LASIX) 40 MG tablet Take 0.5 tablets (20 mg total) by mouth 2 (two) times daily.  Marland Kitchen glucose blood (IGLUCOSE TEST STRIPS) test strip Test twice daily Disp. Brand covered by insurance. Dx e11.9  . Insulin Glargine (LANTUS SOLOSTAR) 100 UNIT/ML Solostar Pen Inject 34 Units into the skin daily at 10 pm.  . isosorbide mononitrate (IMDUR) 30 MG 24 hr tablet Take 1 tablet (30 mg total) by mouth daily.  Marland Kitchen lidocaine (  XYLOCAINE) 5 % ointment Apply 1-2 pumps up to four times daily-rub in well  . linaclotide (LINZESS) 145 MCG CAPS capsule Take 1 capsule (145 mcg total) by mouth daily before breakfast.  . lisinopril (PRINIVIL,ZESTRIL) 2.5 MG tablet Take 1 tablet by mouth daily. Take 1 tablet by mouth daily for kidney protection  . metolazone (ZAROXOLYN) 2.5 MG tablet Take 1 tablet (2.5 mg total) by mouth every Monday, Wednesday, and Friday.  . metoprolol tartrate (LOPRESSOR) 25 MG tablet Take 0.5 tablets (12.5 mg total) by mouth 2 (two) times daily.  .  nitroGLYCERIN (NITROSTAT) 0.4 MG SL tablet Place 0.4 mg under the tongue as directed.  . nystatin (MYCOSTATIN/NYSTOP) powder Apply topically 2 (two) times daily.  . ondansetron (ZOFRAN) 4 MG tablet TAKE ONE TABLET BY MOUTH THREE TIMES DAILY AS NEEDED  . pantoprazole (PROTONIX) 40 MG tablet Take 1 tablet (40 mg total) by mouth daily.  . potassium chloride (MICRO-K) 10 MEQ CR capsule TAKE TWO (2) CAPSULES BY MOUTH DAILY.  . rosuvastatin (CRESTOR) 20 MG tablet Take 1 tablet (20 mg total) by mouth daily.  Marland Kitchen senna-docusate (SENOKOT-S) 8.6-50 MG tablet Take 2 tablets by mouth 2 (two) times daily.  . traMADol (ULTRAM) 50 MG tablet Take 50 mg by mouth every 12 (twelve) hours as needed.  Marland Kitchen UNABLE TO FIND C-BACL-GABA-IBU-PRIL - 2%-2%-5%-2%  Cream.  Apply 1-2 GM ( 1-2 pumps) up to 4 times daily, and rub in well.  Marland Kitchen UNABLE TO FIND accuchek test strips test 4 times daily, lancets test 4 times daily. Dx e11.9  . UNABLE TO FIND Diabetic shoes with inserts based on patients formulary  . aspirin EC 81 MG tablet Take 1 tablet (81 mg total) by mouth daily with breakfast. (Patient not taking: Reported on 01/25/2019)  . [DISCONTINUED] Multiple Vitamins-Minerals (ALIVE WOMENS ENERGY) TABS Take 1 tablet by mouth daily.   No facility-administered encounter medications on file as of 01/25/2019.      Objective: Blood pressure (!) 98/48, pulse 66, temperature 97.8 F (36.6 C), temperature source Oral, resp. rate 18, height 5\' 2"  (1.575 m), weight 243 lb 11.2 oz (110.5 kg). Patient is sitting in wheelchair. She is on nasal O2 at 2 L/min. She appears to be in some distress. She is able to answer questions. Conjunctiva is pink. Sclera is nonicteric Oropharyngeal mucosa is normal. No neck masses or thyromegaly noted. JVD difficult to discern.  She has short neck. Cardiac exam with regular rhythm normal S1 and S2. No murmur or gallop noted. Auscultation of lungs reveal few rales at both bases.  Intensity of breath  sounds is diminished. Abdomen is obese.  It is soft.  She has mild generalized tenderness.  No organomegaly or masses. She has trace to 1+ pitting edema involving both ankles.  Labs/studies Results:  H&H was 8.8 and 28.8 on 11/29/2018. H&H was 9.4 and 30.4 on 12/08/2018. Recent lab data not available.  EGD on 01/19/2019 by Dr. Karlyn Agee revealed small sliding hiatal hernia and gastritis but no evidence of bleeding. Colonoscopy revealed no abnormalities.  Talk melanic stool noted in the region of ileocecal valve and terminal ileum.  Assessment:  #1.  Diarrhea.  She has history of constipation.  Diarrhea started following recent hospitalization and she may have received antibiotic.  Diarrhea seems to be improving.  Has not been taking senna or other laxatives while she is having diarrhea.  If patient is hospitalized for management of her breathing difficulties stool testing could be undertaken during hospitalization.  If  diarrhea persists  #2.  Dyspnea.  She appears to have acute on chronic dyspnea.  She may be back in CHF.  She also has COPD.  Patient will benefit from hospitalization and aggressive diureses along with other measures as appropriate. Patient requests going back to [facility].  #3.  History of iron deficiency anemia secondary to GI bleed most likely from small bowel.  She had EGD and colonoscopy by Dr. Ladona Horns last week and no bleeding lesion identified.  Patient now is back on clopidogrel.  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  Small bowel study may have to be repeated this frequent need for blood transfusion.  #4.  Esophageal motility disorder.  Presently she is not having any issues with dysphagia.   Plan:  Patient advised to report to the emergency room at [facility]. Her sister Lucita Ferrara who drove her here will take her to emergency room. I contacted staff at [facility] emergency room and informed  them that patient is on their way. Medication list updated.  Linzess was deleted and senna changed to on as-needed basis. Office visit 3 months or earlier if necessary.

## 2019-01-25 NOTE — Patient Instructions (Signed)
Patient to go to emergency room at [facility] because of dyspnea at rest.

## 2019-02-06 ENCOUNTER — Ambulatory Visit (INDEPENDENT_AMBULATORY_CARE_PROVIDER_SITE_OTHER): Payer: Medicare Other | Admitting: Internal Medicine

## 2019-02-06 DEATH — deceased

## 2019-05-16 ENCOUNTER — Ambulatory Visit (INDEPENDENT_AMBULATORY_CARE_PROVIDER_SITE_OTHER): Payer: Medicare Other | Admitting: Internal Medicine

## 2020-11-28 IMAGING — CR PORTABLE CHEST - 1 VIEW
1 series · 1 of 1 positions shown · non-contrast
Comparison: Single-view of the chest 10/19/2016 and 10/08/2015. PA
and lateral chest 10/13/2016.

CLINICAL DATA: Worsening shortness of breath over the past week.
History of COPD.

EXAM:
PORTABLE CHEST 1 VIEW

[portable]
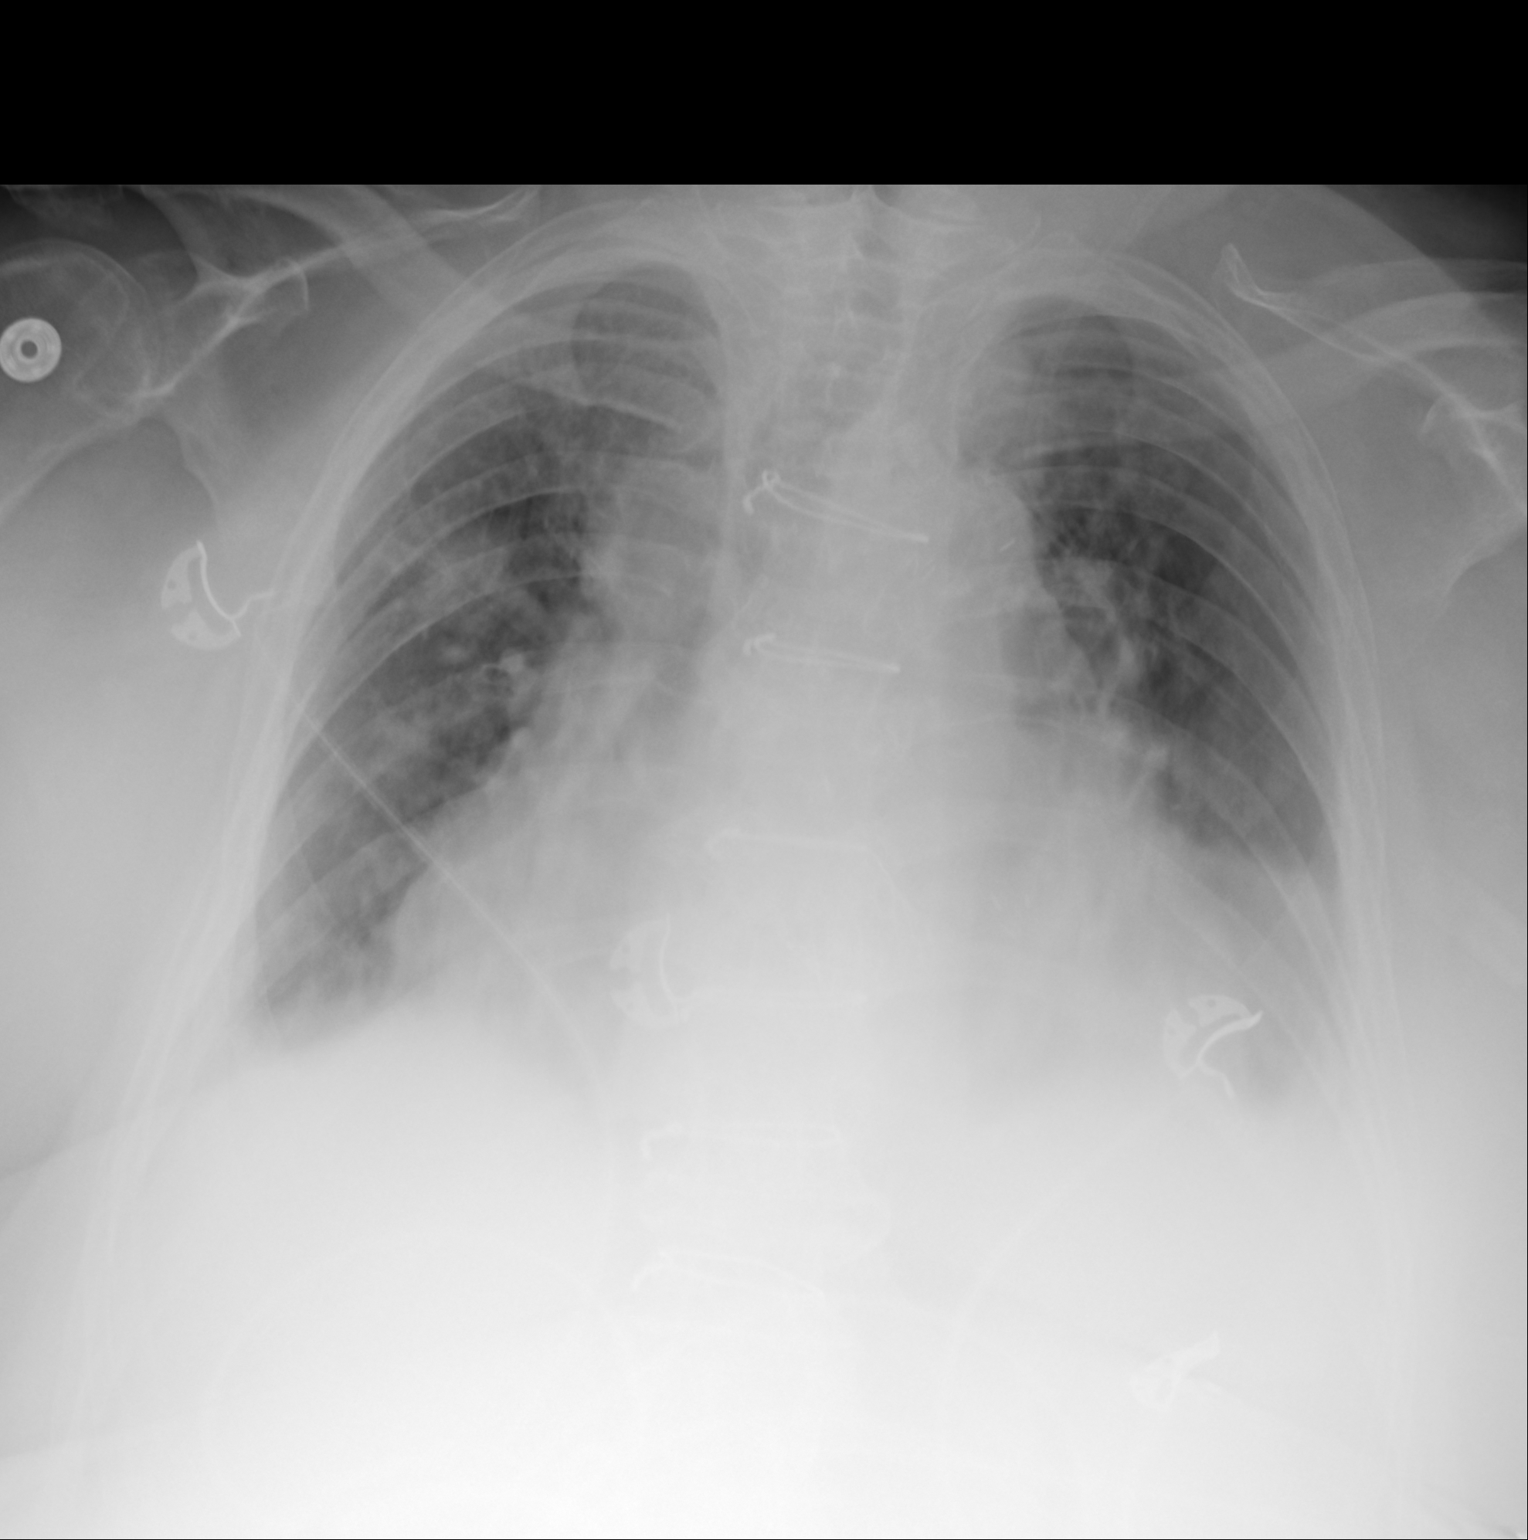

[1 of 1 positions shown; findings below may reference images not displayed]

FINDINGS: Focal nodular opacity is seen in the right upper lung zone. The
lungs otherwise appear clear. No pneumothorax. Blunted appearance of
the costophrenic angles is likely due to portable technique and the
patient's body habitus. Cardiomegaly is seen.
IMPRESSION: Right upper lobe nodular opacity can not be definitively
characterized. Recommend CT chest with contrast for further
evaluation.

Marked cardiomegaly.

## 2021-10-23 ENCOUNTER — Ambulatory Visit (INDEPENDENT_AMBULATORY_CARE_PROVIDER_SITE_OTHER): Payer: Self-pay | Admitting: Orthopaedic Surgery
